# Patient Record
Sex: Female | Born: 1990 | Race: Black or African American | Hispanic: No | Marital: Single | Smoking: Never smoker
Health system: Southern US, Community
[De-identification: ages and names within clinical notes are randomized; demographics above are authoritative.]

## PROBLEM LIST (undated history)

## (undated) DIAGNOSIS — J45909 Unspecified asthma, uncomplicated: Secondary | ICD-10-CM

## (undated) DIAGNOSIS — K219 Gastro-esophageal reflux disease without esophagitis: Secondary | ICD-10-CM

## (undated) HISTORY — PX: WISDOM TOOTH EXTRACTION: SHX21

## (undated) HISTORY — PX: ARTHROSCOPIC REPAIR ACL: SUR80

---
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
## 2012-07-02 ENCOUNTER — Emergency Department (HOSPITAL_COMMUNITY)
Admission: EM | Admit: 2012-07-02 | Discharge: 2012-07-02 | Disposition: A | Discharge status: Home / Self Care | Payer: 59 | Attending: Emergency Medicine | Admitting: Emergency Medicine

## 2012-07-02 ENCOUNTER — Encounter (HOSPITAL_COMMUNITY): Payer: Self-pay | Admitting: Physical Medicine and Rehabilitation

## 2012-07-02 DIAGNOSIS — J3489 Other specified disorders of nose and nasal sinuses: Secondary | ICD-10-CM | POA: Insufficient documentation

## 2012-07-02 DIAGNOSIS — R110 Nausea: Secondary | ICD-10-CM | POA: Insufficient documentation

## 2012-07-02 DIAGNOSIS — J111 Influenza due to unidentified influenza virus with other respiratory manifestations: Secondary | ICD-10-CM

## 2012-07-02 DIAGNOSIS — R630 Anorexia: Secondary | ICD-10-CM | POA: Insufficient documentation

## 2012-07-02 DIAGNOSIS — R51 Headache: Secondary | ICD-10-CM | POA: Insufficient documentation

## 2012-07-02 DIAGNOSIS — R509 Fever, unspecified: Secondary | ICD-10-CM | POA: Insufficient documentation

## 2012-07-02 DIAGNOSIS — J020 Streptococcal pharyngitis: Secondary | ICD-10-CM | POA: Insufficient documentation

## 2012-07-02 DIAGNOSIS — R52 Pain, unspecified: Secondary | ICD-10-CM | POA: Insufficient documentation

## 2012-07-02 DIAGNOSIS — IMO0001 Reserved for inherently not codable concepts without codable children: Secondary | ICD-10-CM | POA: Insufficient documentation

## 2012-07-02 LAB — RAPID STREP SCREEN (MED CTR MEBANE ONLY): Streptococcus, Group A Screen (Direct): POSITIVE — AB

## 2012-07-02 MED ORDER — PENICILLIN V POTASSIUM 500 MG PO TABS: 500.0000 mg | ORAL_TABLET | Freq: Two times a day (BID) | ORAL | Status: AC

## 2012-07-02 MED ORDER — OSELTAMIVIR PHOSPHATE 75 MG PO CAPS: 75.0000 mg | ORAL_CAPSULE | Freq: Two times a day (BID) | ORAL | Status: DC

## 2012-07-02 NOTE — ED Notes (Signed)
Pt presents to department for evaluation of midsternal chest pain, body aches, headache, diaphoresis and fever. Onset yesterday. States temperature of 103.0, 7/10 pain at the time. She is conscious alert and oriented x4. Respirations unlabored.

## 2012-07-02 NOTE — ED Provider Notes (Signed)
History     CSN: [id number]  Arrival date & time 07/02/12  1147   First MD Initiated Contact with Patient 07/02/12 1226      Chief Complaint  Patient presents with  . Chest Pain  . Generalized Body Aches  . Headache    (Consider location/radiation/quality/duration/timing/severity/associated sxs/prior treatment) HPI Comments: Pt presents to the ED with complaints of flu-like symptoms of headache, congestion, sore throat, muscle aches, chills, fevers,  headache, and nausea. The patient states that all of her symptoms started yesterday.  She reports that her oral temperature was 103 yesterday.  Pt did not get the flu shot this year. The patient denies neck pain, weakness, vision changes, abdominal pain, vomiting, SOB,cough,  or wheezing. The patient has tried Alka-Seltzer for her symptoms, but does not feel that it helps.  No known sick contacts.  She is otherwise healthy.  The history is provided by the patient.    No past medical history on file.  No past surgical history on file.  No family history on file.  History  Substance Use Topics  . Smoking status: Never Smoker   . Smokeless tobacco: Not on file  . Alcohol Use: No    OB History    Grav Para Term Preterm Abortions TAB SAB Ect Mult Living                  Review of Systems  Constitutional: Positive for fever, chills and appetite change.  HENT: Positive for sore throat and rhinorrhea. Negative for drooling, trouble swallowing, neck pain, neck stiffness and voice change.   Eyes: Negative for photophobia and visual disturbance.  Respiratory: Negative for cough, shortness of breath and wheezing.   Gastrointestinal: Positive for nausea. Negative for vomiting, abdominal pain and diarrhea.  Genitourinary: Negative for dysuria, urgency, frequency and vaginal discharge.  Musculoskeletal: Positive for myalgias.  Skin: Negative for rash.  Neurological: Positive for headaches. Negative for dizziness, syncope, weakness,  light-headedness and numbness.  Psychiatric/Behavioral: Negative for confusion.  All other systems reviewed and are negative.    Allergies  Review of patient's allergies indicates no known allergies.  Home Medications  No current outpatient prescriptions on file.  BP 111/69  Pulse 96  Temp 98.3 F (36.8 C) (Oral)  Resp 18  SpO2 95%  LMP 06/27/2012  Physical Exam  Nursing note and vitals reviewed. Constitutional: She appears well-developed and well-nourished. No distress.  HENT:  Head: Normocephalic and atraumatic.  Right Ear: Tympanic membrane and ear canal normal.  Left Ear: Tympanic membrane and ear canal normal.  Nose: Nose normal.  Mouth/Throat: Uvula is midline and mucous membranes are normal. Posterior oropharyngeal edema and posterior oropharyngeal erythema present. No oropharyngeal exudate.       No difficulty swallowing Patient handling secretions well Normal voice  Eyes: EOM are normal. Pupils are equal, round, and reactive to light.  Neck: Normal range of motion. Neck supple.  Cardiovascular: Normal rate, regular rhythm and normal heart sounds.   Pulmonary/Chest: Effort normal and breath sounds normal. No respiratory distress. She has no wheezes. She has no rales. She exhibits tenderness.  Abdominal: Soft. Bowel sounds are normal. She exhibits no distension. There is no tenderness. There is no rebound and no guarding.  Musculoskeletal: Normal range of motion.  Lymphadenopathy:    She has cervical adenopathy.  Neurological: She is alert.  Skin: Skin is warm and dry. No rash noted. She is not diaphoretic.    ED Course  Procedures (including critical care time)  Labs Reviewed  RAPID STREP SCREEN   No results found.   No diagnosis found.    MDM  Rapid strep negative.  Patient did not want a shot.  Patient given prescription for Penicillin.  Uvula midline.  Normal voice.  Patient handling secretions well.  No signs of Peritonsillar Abscess at this  time.  Patient discharged home.  Return precautions discussed with the patient.          Pascal Lux Cromwell, PA-C 07/02/12 2157

## 2012-07-09 NOTE — ED Provider Notes (Signed)
Medical screening examination/treatment/procedure(s) were performed by non-physician practitioner and as supervising physician I was immediately available for consultation/collaboration.   Carleene Cooper III, MD 07/09/12 2045

## 2013-09-14 ENCOUNTER — Emergency Department (HOSPITAL_COMMUNITY): Payer: 59

## 2013-09-14 ENCOUNTER — Encounter (HOSPITAL_COMMUNITY): Payer: Self-pay | Admitting: Emergency Medicine

## 2013-09-14 ENCOUNTER — Emergency Department (HOSPITAL_COMMUNITY)
Admission: EM | Admit: 2013-09-14 | Discharge: 2013-09-14 | Disposition: A | Discharge status: Home / Self Care | Payer: 59 | Attending: Emergency Medicine | Admitting: Emergency Medicine

## 2013-09-14 DIAGNOSIS — R21 Rash and other nonspecific skin eruption: Secondary | ICD-10-CM | POA: Insufficient documentation

## 2013-09-14 DIAGNOSIS — S93409A Sprain of unspecified ligament of unspecified ankle, initial encounter: Secondary | ICD-10-CM | POA: Insufficient documentation

## 2013-09-14 DIAGNOSIS — X500XXA Overexertion from strenuous movement or load, initial encounter: Secondary | ICD-10-CM | POA: Insufficient documentation

## 2013-09-14 DIAGNOSIS — Y929 Unspecified place or not applicable: Secondary | ICD-10-CM | POA: Insufficient documentation

## 2013-09-14 DIAGNOSIS — Y9383 Activity, rough housing and horseplay: Secondary | ICD-10-CM | POA: Insufficient documentation

## 2013-09-14 DIAGNOSIS — S93402A Sprain of unspecified ligament of left ankle, initial encounter: Secondary | ICD-10-CM

## 2013-09-14 MED ORDER — HYDROXYZINE PAMOATE 100 MG PO CAPS: 100.0000 mg | ORAL_CAPSULE | Freq: Three times a day (TID) | ORAL | Status: DC | PRN

## 2013-09-14 MED ORDER — PERMETHRIN 5 % EX CREA: TOPICAL_CREAM | CUTANEOUS | Status: DC

## 2013-09-14 NOTE — ED Notes (Signed)
Pt. Stated, i rolled my left ankle 3 days ago and i have a rash all over for 2 days.

## 2013-09-14 NOTE — Discharge Instructions (Signed)
Ankle Sprain °An ankle sprain is an injury to the strong, fibrous tissues (ligaments) that hold the bones of your ankle joint together.  °CAUSES °An ankle sprain is usually caused by a fall or by twisting your ankle. Ankle sprains most commonly occur when you step on the outer edge of your foot, and your ankle turns inward. People who participate in sports are more prone to these types of injuries.  °SYMPTOMS  °· Pain in your ankle. The pain may be present at rest or only when you are trying to stand or walk. °· Swelling. °· Bruising. Bruising may develop immediately or within 1 to 2 days after your injury. °· Difficulty standing or walking, particularly when turning corners or changing directions. °DIAGNOSIS  °Your caregiver will ask you details about your injury and perform a physical exam of your ankle to determine if you have an ankle sprain. During the physical exam, your caregiver will press on and apply pressure to specific areas of your foot and ankle. Your caregiver will try to move your ankle in certain ways. An X-ray exam may be done to be sure a bone was not broken or a ligament did not separate from one of the bones in your ankle (avulsion fracture).  °TREATMENT  °Certain types of braces can help stabilize your ankle. Your caregiver can make a recommendation for this. Your caregiver may recommend the use of medicine for pain. If your sprain is severe, your caregiver may refer you to a surgeon who helps to restore function to parts of your skeletal system (orthopedist) or a physical therapist. °HOME CARE INSTRUCTIONS  °· Apply ice to your injury for 1 2 days or as directed by your caregiver. Applying ice helps to reduce inflammation and pain. °· Put ice in a plastic bag. °· Place a towel between your skin and the bag. °· Leave the ice on for 15-20 minutes at a time, every 2 hours while you are awake. °· Only take over-the-counter or prescription medicines for pain, discomfort, or fever as directed by  your caregiver. °· Elevate your injured ankle above the level of your heart as much as possible for 2 3 days. °· If your caregiver recommends crutches, use them as instructed. Gradually put weight on the affected ankle. Continue to use crutches or a cane until you can walk without feeling pain in your ankle. °· If you have a plaster splint, wear the splint as directed by your caregiver. Do not rest it on anything harder than a pillow for the first 24 hours. Do not put weight on it. Do not get it wet. You may take it off to take a shower or bath. °· You may have been given an elastic bandage to wear around your ankle to provide support. If the elastic bandage is too tight (you have numbness or tingling in your foot or your foot becomes cold and blue), adjust the bandage to make it comfortable. °· If you have an air splint, you may blow more air into it or let air out to make it more comfortable. You may take your splint off at night and before taking a shower or bath. Wiggle your toes in the splint several times per day to decrease swelling. °SEEK MEDICAL CARE IF:  °· You have rapidly increasing bruising or swelling. °· Your toes feel extremely cold or you lose feeling in your foot. °· Your pain is not relieved with medicine. °SEEK IMMEDIATE MEDICAL CARE IF: °· Your toes are numb   or blue.  You have severe pain that is increasing. MAKE SURE YOU:   Understand these instructions.  Will watch your condition.  Will get help right away if you are not doing well or get worse. Document Released: 07/30/2005 Document Revised: 04/23/2012 Document Reviewed: 08/11/2011 [facility]Exit[facility] Patient Information 2014 [place], [place].  Scabies  Scabies are small bugs (mites) that burrow under the skin and cause red bumps and severe itching. These bugs can only be seen with a microscope. Scabies are highly contagious. They can spread easily from person to person by direct contact. They are also spread through sharing clothing or  linens that have the scabies mites living in them. It is not unusual for an entire family to become infected through shared towels, clothing, or bedding.  HOME CARE INSTRUCTIONS  Your caregiver may prescribe a cream or lotion to kill the mites. If cream is prescribed, massage the cream into the entire body from the neck to the bottom of both feet. Also massage the cream into the scalp and face if your child is less than 22 year old. Avoid the eyes and mouth. Do not wash your hands after application.  Leave the cream on for 8 to 12 hours. Your child should bathe or shower after the 8 to 12 hour application period. Sometimes it is helpful to apply the cream to your child right before bedtime.  One treatment is usually effective and will eliminate approximately 95% of infestations. For severe cases, your caregiver may decide to repeat the treatment in 1 week. Everyone in your household should be treated with one application of the cream.  New rashes or burrows should not appear within 24 to 48 hours after successful treatment. However, the itching and rash may last for 2 to 4 weeks after successful treatment. Your caregiver may prescribe a medicine to help with the itching or to help the rash go away more quickly.  Scabies can live on clothing or linens for up to 3 days. All of your child's recently used clothing, towels, stuffed toys, and bed linens should be washed in hot water and then dried in a dryer for at least 20 minutes on high heat. Items that cannot be washed should be enclosed in a plastic bag for at least 3 days.  To help relieve itching, bathe your child in a cool bath or apply cool washcloths to the affected areas.  Your child may return to school after treatment with the prescribed cream. SEEK MEDICAL CARE IF:  The itching persists longer than 4 weeks after treatment.  The rash spreads or becomes infected. Signs of infection include red blisters or yellow-tan crust. Document Released:  07/30/2005 Document Revised: 10/22/2011 Document Reviewed: 12/08/2008  [facility] Patient Information 2014 [place]ExitCare, [place]LL[place].

## 2013-09-14 NOTE — ED Provider Notes (Signed)
CSN: [id number]     Arrival date & time 09/14/13  1814 History  This chart was scribed for non-physician practitioner Arthor Captain, PA-C, working with Toy Baker, MD by Nicholos Johns, ED scribe. This patient was seen in room TR09C/TR09C and the patient's care was started at 6:30 PM.  Chief Complaint  Patient presents with  . Ankle Pain  . Rash   The history is provided by the patient. No language interpreter was used.    HPI Comments: Ramyah Sw[place] is a 23 y.o. female who presents to the Emergency Department complaining of a diffuse rash on the neck and legs, onset 2 days ago. Pt denies change in lotion, detergent, or soap recently. Pt has never had eczema. Pt has contacts with individuals who have been diagnosed with scabies. Denies hx of asthma   Pt rolled left ankle today. States she was playing with her friend's daughter and stepped the wrong way. Reports laying in the grass for 15 minutes following incident unable to walk and had to have help getting up the stairs; was limping. Has been walking on tip toes since the incident. Reports initial swelling that has since gone down.    No past medical history on file. No past surgical history on file. No family history on file. History  Substance Use Topics  . Smoking status: Never Smoker   . Smokeless tobacco: Not on file  . Alcohol Use: No   OB History   Grav Para Term Preterm Abortions TAB SAB Ect Mult Living                 Review of Systems  Constitutional: Negative for fever and chills.  HENT: Negative for congestion and rhinorrhea.   Respiratory: Negative for cough and shortness of breath.   Musculoskeletal: Positive for myalgias.  Skin: Positive for rash.    Allergies  Review of patient's allergies indicates no known allergies.  Home Medications   Current Outpatient Rx  Name  Route  Sig  Dispense  Refill  . oseltamivir (TAMIFLU) 75 MG capsule   Oral   Take 1 capsule (75 mg total) by mouth every 12  (twelve) hours.   10 capsule   0    There were no vitals taken for this visit. Physical Exam  Nursing note and vitals reviewed. Constitutional: She is oriented to person, place, and time. She appears well-developed and well-nourished. No distress.  HENT:  Head: Normocephalic and atraumatic.  Eyes: EOM are normal.  Neck: Neck supple. No thyromegaly present.  Cardiovascular: Normal rate.   Pulmonary/Chest: Effort normal. No respiratory distress.  Musculoskeletal: Normal range of motion.  Pain and tenderness over left lateral malleolus and fifth metatarsal head. X-ray has been ordered.  Lymphadenopathy:    She has no cervical adenopathy.  Neurological: She is alert and oriented to person, place, and time.  Skin: Skin is warm and dry. Rash noted.  Fine lacy macular papular rash on neck.   Psychiatric: She has a normal mood and affect. Her behavior is normal.    ED Course  Procedures (including critical care time) DIAGNOSTIC STUDIES: Oxygen Saturation is 100% on RA, Normal by my interpretation.    COORDINATION OF CARE: At 6:30 PM: Discussed treatment plan with patient which includes x-ray of the left ankle.. Patient agrees.   Labs Review Labs Reviewed - No data to display Imaging Review Dg Ankle Complete Left  09/14/2013   CLINICAL DATA:  Ankle injury with pain and swelling.  EXAM: LEFT ANKLE  COMPLETE - 3+ VIEW  COMPARISON:  None.  FINDINGS: There is no evidence of fracture, dislocation, or joint effusion. There is no evidence of arthropathy or other focal bone abnormality. Soft tissues are unremarkable.  IMPRESSION: Normal exam.   Electronically Signed   By: Kennith CenterEric  Mansell M.D.   On: 09/14/2013 20:05    EKG Interpretation   None       MDM   1. Left ankle sprain   2. Rash    Patient X-Ray negative for obvious fracture or dislocation. Pain managed in ED. Pt advised to follow up with orthopedics if symptoms persist for possibility of missed fracture diagnosis. Patient given  brace while in ED, conservative therapy recommended and discussed. Patient will be dc home & is agreeable with above plan.  Patient rash will be treated with permethrin considering her exposure. Supportive care for ithching.  I personally performed the services described in this documentation, which was scribed in my presence. The recorded information has been reviewed and is accurate.       Arthor CaptainAbigail Janiece Scovill, PA-C 09/16/13 1057

## 2013-09-14 NOTE — ED Notes (Signed)
PT ambulated with baseline gait; VSS; A&Ox3; no signs of distress; respirations even and unlabored; skin warm and dry; no questions upon discharge.  

## 2013-09-19 NOTE — ED Provider Notes (Signed)
Medical screening examination/treatment/procedure(s) were performed by non-physician practitioner and as supervising physician I was immediately available for consultation/collaboration.  Toy BakerAnthony T Alexus Michael, MD 09/19/13 [phone]

## 2014-01-05 ENCOUNTER — Emergency Department (HOSPITAL_COMMUNITY): Payer: 59

## 2014-01-05 ENCOUNTER — Emergency Department (HOSPITAL_COMMUNITY)
Admission: EM | Admit: 2014-01-05 | Discharge: 2014-01-06 | Disposition: A | Discharge status: Home / Self Care | Payer: 59 | Attending: Emergency Medicine | Admitting: Emergency Medicine

## 2014-01-05 ENCOUNTER — Encounter (HOSPITAL_COMMUNITY): Payer: Self-pay | Admitting: Emergency Medicine

## 2014-01-05 DIAGNOSIS — R5381 Other malaise: Secondary | ICD-10-CM | POA: Insufficient documentation

## 2014-01-05 DIAGNOSIS — R000 Tachycardia, unspecified: Secondary | ICD-10-CM | POA: Insufficient documentation

## 2014-01-05 DIAGNOSIS — R51 Headache: Secondary | ICD-10-CM | POA: Insufficient documentation

## 2014-01-05 DIAGNOSIS — Z79899 Other long term (current) drug therapy: Secondary | ICD-10-CM | POA: Insufficient documentation

## 2014-01-05 DIAGNOSIS — J020 Streptococcal pharyngitis: Secondary | ICD-10-CM

## 2014-01-05 DIAGNOSIS — R5383 Other fatigue: Secondary | ICD-10-CM

## 2014-01-05 HISTORY — DX: Gastro-esophageal reflux disease without esophagitis: K21.9

## 2014-01-05 LAB — CBC
HCT: 33.6 % — ABNORMAL LOW (ref 36.0–46.0)
Hemoglobin: 11.5 g/dL — ABNORMAL LOW (ref 12.0–15.0)
MCH: 29.8 pg (ref 26.0–34.0)
MCHC: 34.2 g/dL (ref 30.0–36.0)
MCV: 87 fL (ref 78.0–100.0)
Platelets: 250 10*3/uL (ref 150–400)
RBC: 3.86 MIL/uL — ABNORMAL LOW (ref 3.87–5.11)
RDW: 14.2 % (ref 11.5–15.5)
WBC: 18.2 10*3/uL — ABNORMAL HIGH (ref 4.0–10.5)

## 2014-01-05 LAB — BASIC METABOLIC PANEL
BUN: 8 mg/dL (ref 6–23)
CO2: 22 mEq/L (ref 19–32)
Calcium: 9.6 mg/dL (ref 8.4–10.5)
Chloride: 101 mEq/L (ref 96–112)
Creatinine, Ser: 0.83 mg/dL (ref 0.50–1.10)
GFR calc Af Amer: >90 mL/min (ref >90)
GFR calc non Af Amer: >90 mL/min (ref >90)
Glucose, Bld: 90 mg/dL (ref 70–99)
Potassium: 3.3 mEq/L — ABNORMAL LOW (ref 3.7–5.3)
Sodium: 140 mEq/L (ref 137–147)

## 2014-01-05 LAB — RAPID STREP SCREEN (MED CTR MEBANE ONLY): Streptococcus, Group A Screen (Direct): POSITIVE — AB

## 2014-01-05 LAB — I-STAT TROPONIN, ED: Troponin i, poc: 0 ng/mL (ref 0.00–0.08)

## 2014-01-05 MED ORDER — SODIUM CHLORIDE 0.9 % IV BOLUS (SEPSIS): 500.0000 mL | Freq: Once | INTRAVENOUS | Status: DC

## 2014-01-05 MED ORDER — ACETAMINOPHEN 325 MG PO TABS
650.0000 mg | ORAL_TABLET | Freq: Four times a day (QID) | ORAL | Status: DC | PRN
  Administered 2014-01-05: 650 mg via ORAL

## 2014-01-05 NOTE — ED Provider Notes (Signed)
CSN: [id number]     Arrival date & time 01/05/14  1919 History   First MD Initiated Contact with Patient 01/05/14 2241     Chief Complaint  Patient presents with  . Chest Pain  . Headache     (Consider location/radiation/quality/duration/timing/severity/associated sxs/prior Treatment) Patient is a 23 y.o. female presenting with chest pain and headaches. The history is provided by the patient. No language interpreter was used.  Chest Pain Pain location:  R chest Pain quality: sharp   Pain radiates to:  Does not radiate Pain radiates to the back: no   Pain severity:  Mild Onset quality:  Sudden Duration:  8 hours Timing:  Intermittent Progression:  Waxing and waning Context: breathing   Relieved by:  None tried Worsened by:  Deep breathing Ineffective treatments:  None tried Associated symptoms: fatigue, fever and headache   Risk factors: no birth control and no smoking   Headache Associated symptoms: fatigue, fever and sore throat     Past Medical History  Diagnosis Date  . GERD (gastroesophageal reflux disease)    Past Surgical History  Procedure Laterality Date  . Arthroscopic repair acl    . Wisdom tooth extraction     No family history on file. History  Substance Use Topics  . Smoking status: Never Smoker   . Smokeless tobacco: Not on file  . Alcohol Use: No   OB History   Grav Para Term Preterm Abortions TAB SAB Ect Mult Living                 Review of Systems  Constitutional: Positive for fever and fatigue.  HENT: Positive for sore throat.   Cardiovascular: Positive for chest pain.  Neurological: Positive for headaches.  All other systems reviewed and are negative.     Allergies  Review of patient's allergies indicates no known allergies.  Home Medications   Prior to Admission medications   Medication Sig Start Date End Date Taking? Authorizing Provider  hydrOXYzine (VISTARIL) 100 MG capsule Take 1 capsule (100 mg total) by mouth 3 (three)  times daily as needed for itching. 09/14/13  Yes Abigail Harris, PA-C   BP 109/55  Pulse 100  Temp(Src) 99.9 F (37.7 C) (Oral)  Resp 24  SpO2 100%  LMP 01/04/2014 Physical Exam  Nursing note and vitals reviewed. Constitutional: She is oriented to person, place, and time. She appears well-developed and well-nourished.  HENT:  Head: Normocephalic.  Mouth/Throat: Posterior oropharyngeal erythema present.  Cardiovascular: Regular rhythm.  Tachycardia present.   Pulmonary/Chest: Effort normal and breath sounds normal.  Abdominal: Soft. Bowel sounds are normal.  Musculoskeletal: She exhibits no edema and no tenderness.  Neurological: She is alert and oriented to person, place, and time. No cranial nerve deficit.  Skin: Skin is warm and dry.  Psychiatric: She has a normal mood and affect.    ED Course  Procedures (including critical care time) Labs Review Labs Reviewed  CBC - Abnormal; Notable for the following:    WBC 18.2 (*)    RBC 3.86 (*)    Hemoglobin 11.5 (*)    HCT 33.6 (*)    All other components within normal limits  BASIC METABOLIC PANEL - Abnormal; Notable for the following:    Potassium 3.3 (*)    All other components within normal limits  Rosezena SensorI-STAT TROPOININ, ED    Imaging Review Dg Chest 2 View  01/05/2014   CLINICAL DATA:  Chest pain  EXAM: CHEST  2 VIEW  COMPARISON:  08/18/2010  FINDINGS: The heart size and mediastinal contours are within normal limits. Both lungs are clear. The visualized skeletal structures are unremarkable.  IMPRESSION: No active cardiopulmonary disease.   Electronically Signed   By: Ruel Favors M.D.   On: 01/05/2014 21:16     EKG Interpretation None      Date: 01/06/2014  Rate: 113  Rhythm: sinus tachycardia  QRS Axis: normal  Intervals: normal  ST/T Wave abnormalities: nonspecific T wave changes  Conduction Disutrbances:none  Narrative Interpretation:  Sinus tachycardia  Old EKG Reviewed: none available  Lab and radiology results  reviewed and shared with patient.  Positive for strep.  Will treat with penicilllin VK x 10 days. MDM   Final diagnoses:  None    Strep pharyngitis, pleuritic chest pain.    Jimmye Norman, NP 01/06/14 0020

## 2014-01-05 NOTE — ED Provider Notes (Signed)
Patient presented to the ER with headache, sore throat, cough, chest pain.  Face to face Exam: HEENT - PERRLA Lungs - CTAB Heart - RRR, no M/R/G Abd - S/NT/ND Neuro - alert, oriented x3  Plan: Patient has very low risk for PE. It is felt that her mild tachycardia at arrival was due to fever. She has tested positive for strep which explains her symptoms. Will treat for strep pharyngitis and pleurisy.   Gilda Crease, MD 01/05/14 [phone]

## 2014-01-05 NOTE — ED Notes (Signed)
Attempted Iv stick x2. Pt states that she wishes to just drink fluids instead of receiving them through the IV.

## 2014-01-05 NOTE — ED Notes (Signed)
Pt states that she was having a sore throat last night and today CP today around 1500 and a headache.  NAD noted at current

## 2014-01-05 NOTE — ED Notes (Signed)
Pt refuses blood draw at this time.

## 2014-01-06 MED ORDER — PENICILLIN V POTASSIUM 500 MG PO TABS: 500.0000 mg | ORAL_TABLET | Freq: Three times a day (TID) | ORAL | Status: DC

## 2014-01-06 MED ORDER — NAPROXEN 500 MG PO TABS: 500.0000 mg | ORAL_TABLET | Freq: Two times a day (BID) | ORAL | Status: DC

## 2014-01-06 MED ORDER — PENICILLIN V POTASSIUM 250 MG PO TABS
500.0000 mg | ORAL_TABLET | Freq: Once | ORAL | Status: AC
  Administered 2014-01-06: 500 mg via ORAL
  Filled 2014-01-06: qty 2

## 2014-01-06 MED ORDER — NAPROXEN 250 MG PO TABS
500.0000 mg | ORAL_TABLET | Freq: Once | ORAL | Status: AC
  Administered 2014-01-06: 500 mg via ORAL
  Filled 2014-01-06: qty 2

## 2014-01-06 NOTE — ED Provider Notes (Signed)
Medical screening examination/treatment/procedure(s) were conducted as a shared visit with non-physician practitioner(s) and myself.  I personally evaluated the patient during the encounter.  Please see separate associated note for evaluation and plan.    EKG Interpretation   Date/Time:  Tuesday Jan 05 2014 19:28:17 EDT Ventricular Rate:  113 PR Interval:  144 QRS Duration: 70 QT Interval:  288 QTC Calculation: 395 R Axis:   72 Text Interpretation:  Sinus tachycardia Nonspecific T wave abnormality  Abnormal ECG No significant change since last tracing Confirmed by Blinda Leatherwood   MD, CHRISTOPHER [phone]) on 01/05/2014 11:37:47 PM       Gilda Crease, MD 01/06/14 0021

## 2014-01-06 NOTE — Discharge Instructions (Signed)
Pleurisy Pleurisy is redness, puffiness (swelling), and soreness (inflammation) of the lining of the lungs. It can be hard to breathe and hurt to breathe. Coughing or deep breathing will make it hurt more. It is often caused by an existing infection or disease.  HOME CARE  Only take medicine as told by your doctor.  Only take antibiotic medicine as directed. Make sure to finish it even if you start to feel better. GET HELP RIGHT AWAY IF:   Your lips, fingernails, or toenails are blue or dark.  You cough up blood.  You have a hard time breathing.  Your pain is not controlled with medicine or it lasts for more than 1 week.  Your pain spreads (radiates) into your neck, arms, or jaw.  You are short of breath or wheezing.  You develop a fever, rash, throw up (vomit), or faint. MAKE SURE YOU:   Understand these instructions.  Will watch your condition.  Will get help right away if you are not doing well or get worse. Document Released: 07/12/2008 Document Revised: 04/01/2013 Document Reviewed: 01/11/2013 [facility] Patient Information 2014 [place], [place]. Strep Throat Strep throat is an infection of the throat caused by a bacteria named Streptococcus pyogenes. Your caregiver may call the infection streptococcal "tonsillitis" or "pharyngitis" depending on whether there are signs of inflammation in the tonsils or back of the throat. Strep throat is most common in children aged 5 15 years during the cold months of the year, but it can occur in people of any age during any season. This infection is spread from person to person (contagious) through coughing, sneezing, or other close contact. SYMPTOMS   Fever or chills.  Painful, swollen, red tonsils or throat.  Pain or difficulty when swallowing.  White or yellow spots on the tonsils or throat.  Swollen, tender lymph nodes or "glands" of the neck or under the jaw.  Red rash all over the body (rare). DIAGNOSIS  Many different  infections can cause the same symptoms. A test must be done to confirm the diagnosis so the right treatment can be given. A "rapid strep test" can help your caregiver make the diagnosis in a few minutes. If this test is not available, a light swab of the infected area can be used for a throat culture test. If a throat culture test is done, results are usually available in a day or two. TREATMENT  Strep throat is treated with antibiotic medicine. HOME CARE INSTRUCTIONS   Gargle with 1 tsp of salt in 1 cup of warm water, 3 4 times per day or as needed for comfort.  Family members who also have a sore throat or fever should be tested for strep throat and treated with antibiotics if they have the strep infection.  Make sure everyone in your household washes their hands well.  Do not share food, drinking cups, or personal items that could cause the infection to spread to others.  You may need to eat a soft food diet until your sore throat gets better.  Drink enough water and fluids to keep your urine clear or pale yellow. This will help prevent dehydration.  Get plenty of rest.  Stay home from school, daycare, or work until you have been on antibiotics for 24 hours.  Only take over-the-counter or prescription medicines for pain, discomfort, or fever as directed by your caregiver.  If antibiotics are prescribed, take them as directed. Finish them even if you start to feel better. SEEK MEDICAL CARE IF:  The glands in your neck continue to enlarge.  You develop a rash, cough, or earache.  You cough up green, yellow-brown, or bloody sputum.  You have pain or discomfort not controlled by medicines.  Your problems seem to be getting worse rather than better. SEEK IMMEDIATE MEDICAL CARE IF:   You develop any new symptoms such as vomiting, severe headache, stiff or painful neck, chest pain, shortness of breath, or trouble swallowing.  You develop severe throat pain, drooling, or changes in  your voice.  You develop swelling of the neck, or the skin on the neck becomes red and tender.  You have a fever.  You develop signs of dehydration, such as fatigue, dry mouth, and decreased urination.  You become increasingly sleepy, or you cannot wake up completely. Document Released: 07/27/2000 Document Revised: 07/16/2012 Document Reviewed: 09/28/2010 [facility] Patient Information 2014 [place].

## 2014-06-25 ENCOUNTER — Emergency Department (HOSPITAL_COMMUNITY)
Admission: EM | Admit: 2014-06-25 | Discharge: 2014-06-26 | Disposition: A | Discharge status: Home / Self Care | Payer: 59 | Attending: Emergency Medicine | Admitting: Emergency Medicine

## 2014-06-25 ENCOUNTER — Encounter (HOSPITAL_COMMUNITY): Payer: Self-pay | Admitting: Emergency Medicine

## 2014-06-25 ENCOUNTER — Emergency Department (HOSPITAL_COMMUNITY): Payer: 59

## 2014-06-25 DIAGNOSIS — R000 Tachycardia, unspecified: Secondary | ICD-10-CM | POA: Diagnosis not present

## 2014-06-25 DIAGNOSIS — J111 Influenza due to unidentified influenza virus with other respiratory manifestations: Secondary | ICD-10-CM | POA: Insufficient documentation

## 2014-06-25 DIAGNOSIS — R112 Nausea with vomiting, unspecified: Secondary | ICD-10-CM | POA: Diagnosis not present

## 2014-06-25 DIAGNOSIS — Z791 Long term (current) use of non-steroidal anti-inflammatories (NSAID): Secondary | ICD-10-CM | POA: Diagnosis not present

## 2014-06-25 DIAGNOSIS — R69 Illness, unspecified: Secondary | ICD-10-CM

## 2014-06-25 DIAGNOSIS — Z8719 Personal history of other diseases of the digestive system: Secondary | ICD-10-CM | POA: Diagnosis not present

## 2014-06-25 DIAGNOSIS — J45909 Unspecified asthma, uncomplicated: Secondary | ICD-10-CM | POA: Insufficient documentation

## 2014-06-25 DIAGNOSIS — R05 Cough: Secondary | ICD-10-CM | POA: Diagnosis present

## 2014-06-25 DIAGNOSIS — R079 Chest pain, unspecified: Secondary | ICD-10-CM

## 2014-06-25 DIAGNOSIS — Z792 Long term (current) use of antibiotics: Secondary | ICD-10-CM | POA: Diagnosis not present

## 2014-06-25 LAB — BASIC METABOLIC PANEL
Anion gap: 18 — ABNORMAL HIGH (ref 5–15)
BUN: 9 mg/dL (ref 6–23)
CHLORIDE: 97 meq/L (ref 96–112)
CO2: 23 meq/L (ref 19–32)
Calcium: 9.9 mg/dL (ref 8.4–10.5)
Creatinine, Ser: 0.84 mg/dL (ref 0.50–1.10)
GFR calc Af Amer: >90 mL/min (ref >90)
GLUCOSE: 80 mg/dL (ref 70–99)
Potassium: 3.8 mEq/L (ref 3.7–5.3)
Sodium: 138 mEq/L (ref 137–147)

## 2014-06-25 LAB — URINALYSIS, ROUTINE W REFLEX MICROSCOPIC
GLUCOSE, UA: NEGATIVE mg/dL
Leukocytes, UA: NEGATIVE
Nitrite: NEGATIVE
PROTEIN: NEGATIVE mg/dL
Specific Gravity, Urine: 1.028 (ref 1.005–1.030)
Urobilinogen, UA: 1 mg/dL (ref 0.0–1.0)
pH: 6.5 (ref 5.0–8.0)

## 2014-06-25 LAB — CBC
HEMATOCRIT: 37.6 % (ref 36.0–46.0)
Hemoglobin: 12.9 g/dL (ref 12.0–15.0)
MCH: 29.4 pg (ref 26.0–34.0)
MCHC: 34.3 g/dL (ref 30.0–36.0)
MCV: 85.6 fL (ref 78.0–100.0)
Platelets: 296 10*3/uL (ref 150–400)
RBC: 4.39 MIL/uL (ref 3.87–5.11)
RDW: 14 % (ref 11.5–15.5)
WBC: 17.3 10*3/uL — AB (ref 4.0–10.5)

## 2014-06-25 LAB — I-STAT CHEM 8, ED
BUN: 7 mg/dL (ref 6–23)
Calcium, Ion: 1.04 mmol/L — ABNORMAL LOW (ref 1.12–1.23)
Chloride: 103 mEq/L (ref 96–112)
Creatinine, Ser: 0.8 mg/dL (ref 0.50–1.10)
Glucose, Bld: 140 mg/dL — ABNORMAL HIGH (ref 70–99)
HCT: 38 % (ref 36.0–46.0)
HEMOGLOBIN: 12.9 g/dL (ref 12.0–15.0)
Potassium: 3.2 mEq/L — ABNORMAL LOW (ref 3.7–5.3)
SODIUM: 137 meq/L (ref 137–147)
TCO2: 21 mmol/L (ref 0–100)

## 2014-06-25 LAB — I-STAT TROPONIN, ED: TROPONIN I, POC: 0 ng/mL (ref 0.00–0.08)

## 2014-06-25 LAB — I-STAT CG4 LACTIC ACID, ED
LACTIC ACID, VENOUS: 3.57 mmol/L — AB (ref 0.5–2.2)
LACTIC ACID, VENOUS: 0.51 mmol/L (ref 0.5–2.2)

## 2014-06-25 LAB — I-STAT BETA HCG BLOOD, ED (MC, WL, AP ONLY)

## 2014-06-25 LAB — URINE MICROSCOPIC-ADD ON

## 2014-06-25 MED ORDER — ACETAMINOPHEN 325 MG PO TABS: 650.0000 mg | ORAL_TABLET | Freq: Once | ORAL | Status: DC

## 2014-06-25 MED ORDER — PROMETHAZINE HCL 25 MG PO TABS: 25.0000 mg | ORAL_TABLET | Freq: Four times a day (QID) | ORAL | Status: DC | PRN

## 2014-06-25 MED ORDER — KETOROLAC TROMETHAMINE 15 MG/ML IJ SOLN
15.0000 mg | Freq: Once | INTRAMUSCULAR | Status: AC
  Administered 2014-06-25: 15 mg via INTRAVENOUS
  Filled 2014-06-25: qty 1

## 2014-06-25 MED ORDER — HYDROCODONE-ACETAMINOPHEN 5-325 MG PO TABS: ORAL_TABLET | ORAL | Status: DC

## 2014-06-25 MED ORDER — OSELTAMIVIR PHOSPHATE 75 MG PO CAPS: 75.0000 mg | ORAL_CAPSULE | Freq: Two times a day (BID) | ORAL | Status: DC

## 2014-06-25 MED ORDER — ACETAMINOPHEN 325 MG PO TABS
650.0000 mg | ORAL_TABLET | Freq: Once | ORAL | Status: AC
  Administered 2014-06-25: 650 mg via ORAL
  Filled 2014-06-25: qty 2

## 2014-06-25 MED ORDER — SODIUM CHLORIDE 0.9 % IV BOLUS (SEPSIS)
1000.0000 mL | Freq: Once | INTRAVENOUS | Status: AC
  Administered 2014-06-25: 1000 mL via INTRAVENOUS

## 2014-06-25 MED ORDER — DIPHENHYDRAMINE HCL 50 MG/ML IJ SOLN
25.0000 mg | Freq: Once | INTRAMUSCULAR | Status: AC
  Administered 2014-06-25: 25 mg via INTRAVENOUS
  Filled 2014-06-25: qty 1

## 2014-06-25 MED ORDER — MORPHINE SULFATE 4 MG/ML IJ SOLN: 4.0000 mg | INTRAMUSCULAR | Status: DC | PRN

## 2014-06-25 MED ORDER — METOCLOPRAMIDE HCL 5 MG/ML IJ SOLN
10.0000 mg | Freq: Once | INTRAMUSCULAR | Status: AC
  Administered 2014-06-25: 10 mg via INTRAVENOUS
  Filled 2014-06-25: qty 2

## 2014-06-25 MED ORDER — OSELTAMIVIR PHOSPHATE 75 MG PO CAPS
75.0000 mg | ORAL_CAPSULE | Freq: Once | ORAL | Status: AC
  Administered 2014-06-25: 75 mg via ORAL
  Filled 2014-06-25: qty 1

## 2014-06-25 MED ORDER — ACETAMINOPHEN 325 MG PO TABS
975.0000 mg | ORAL_TABLET | Freq: Once | ORAL | Status: AC
  Administered 2014-06-25: 975 mg via ORAL
  Filled 2014-06-25: qty 3

## 2014-06-25 MED ORDER — MORPHINE SULFATE 4 MG/ML IJ SOLN
4.0000 mg | Freq: Once | INTRAMUSCULAR | Status: AC
Start: 2014-06-25 — End: 2014-06-25
  Administered 2014-06-25: 4 mg via INTRAVENOUS
  Filled 2014-06-25: qty 1

## 2014-06-25 MED ORDER — ONDANSETRON HCL 4 MG/2ML IJ SOLN
4.0000 mg | Freq: Once | INTRAMUSCULAR | Status: AC
  Administered 2014-06-25: 4 mg via INTRAVENOUS
  Filled 2014-06-25: qty 2

## 2014-06-25 MED ORDER — SODIUM CHLORIDE 0.9 % IV BOLUS (SEPSIS): 1000.0000 mL | Freq: Once | INTRAVENOUS | Status: DC

## 2014-06-25 NOTE — ED Notes (Signed)
CG-4 result reported to Nicole-PA.

## 2014-06-25 NOTE — ED Notes (Signed)
Pt reports productive cough, sob, cp with breathing, nasal congestion x 2 days.

## 2014-06-25 NOTE — ED Provider Notes (Signed)
CSN: [id number]     Arrival date & time 06/25/14  1641 History   First MD Initiated Contact with Patient 06/25/14 1955     Chief Complaint  Patient presents with  . Chest Pain  . Cough     (Consider location/radiation/quality/duration/timing/severity/associated sxs/prior Treatment) HPI   Rhodia Swaziland is a 23 y.o. female with past medical history significant for asthma complaining of rhinorrhea, productive cough, sore throat, myalgia, headache (diffuse, typical for her chronic headaches) and several episodes of nonbloody, nonbilious, non coffee-ground emesis and pleuritic CP. Patient denies shortness of breath, cervicalgia, rash, dysuria, hematuria, focal abdominal pain, diarrhea, sick contacts, tick bites, camping or hiking. She has not had her flu shot this year.  Past Medical History  Diagnosis Date  . GERD (gastroesophageal reflux disease)    Past Surgical History  Procedure Laterality Date  . Arthroscopic repair acl    . Wisdom tooth extraction     No family history on file. History  Substance Use Topics  . Smoking status: Never Smoker   . Smokeless tobacco: Not on file  . Alcohol Use: No   OB History    No data available     Review of Systems  10 systems reviewed and found to be negative, except as noted in the HPI.   Allergies  Review of patient's allergies indicates no known allergies.  Home Medications   Prior to Admission medications   Medication Sig Start Date End Date Taking? Authorizing Provider  hydrOXYzine (VISTARIL) 100 MG capsule Take 1 capsule (100 mg total) by mouth 3 (three) times daily as needed for itching. Patient not taking: Reported on 06/25/2014 09/14/13   Arthor Captain, PA-C  naproxen (NAPROSYN) 500 MG tablet Take 1 tablet (500 mg total) by mouth 2 (two) times daily. Patient not taking: Reported on 06/25/2014 01/06/14   Jimmye Norman, NP  penicillin v potassium (VEETID) 500 MG tablet Take 1 tablet (500 mg total) by mouth 3 (three) times  daily. Patient not taking: Reported on 06/25/2014 01/06/14   Jimmye Norman, NP   BP 106/70 mmHg  Pulse 113  Temp(Src) 101.1 F (38.4 C)  Resp 18  Wt 147 lb 6 oz (66.849 kg)  SpO2 100%  LMP 06/10/2014 (Approximate) Physical Exam  Constitutional: She is oriented to person, place, and time. She appears well-developed and well-nourished. No distress.  HENT:  Head: Normocephalic and atraumatic.  Mouth/Throat: Oropharynx is clear and moist.  No drooling or stridor. Posterior pharynx mildly erythematous no significant tonsillar hypertrophy. No exudate. Soft palate rises symmetrically. No TTP or induration under tongue.   No tenderness to palpation of frontal or bilateral maxillary sinuses.  No mucosal edema in the nares.  Bilateral tympanic membranes with normal architecture and good light reflex.    Eyes: Conjunctivae and EOM are normal. Pupils are equal, round, and reactive to light.  Neck: Normal range of motion. Neck supple.  FROM to C-spine. Pt can touch chin to chest without discomfort. No TTP of midline cervical spine.   Cardiovascular: Regular rhythm and intact distal pulses.   No murmur heard. Mild tachycardia  Pulmonary/Chest: Effort normal and breath sounds normal. No stridor. No respiratory distress. She has no wheezes. She has no rales. She exhibits no tenderness.  Abdominal: Soft. Bowel sounds are normal. She exhibits no distension and no mass. There is no tenderness. There is no rebound and no guarding.  Musculoskeletal: Normal range of motion. She exhibits no edema or tenderness.  Lymphadenopathy:    She  has no cervical adenopathy.  Neurological: She is alert and oriented to person, place, and time. No cranial nerve deficit.  Psychiatric: She has a normal mood and affect.  Nursing note and vitals reviewed.   ED Course  Procedures (including critical care time) Labs Review Labs Reviewed  CBC - Abnormal; Notable for the following:    WBC 17.3 (*)    All other  components within normal limits  BASIC METABOLIC PANEL - Abnormal; Notable for the following:    Anion gap 18 (*)    All other components within normal limits  I-STAT TROPOININ, ED    Imaging Review Dg Chest 2 View  06/25/2014   CLINICAL DATA:  Chest pain for 2 days was shortness of breath and cough  EXAM: CHEST  2 VIEW  COMPARISON:  01/05/2014  FINDINGS: The heart size and mediastinal contours are within normal limits. Both lungs are clear. The visualized skeletal structures are unremarkable.  IMPRESSION: No active cardiopulmonary disease.   Electronically Signed   By: Alcide CleverMark  Lukens M.D.   On: 06/25/2014 18:45     EKG Interpretation   Date/Time:  Friday June 25 2014 16:44:48 EST Ventricular Rate:  119 PR Interval:  134 QRS Duration: 66 QT Interval:  294 QTC Calculation: 413 R Axis:   68 Text Interpretation:  Sinus tachycardia Nonspecific T wave abnormality  Abnormal ECG since last tracing no significant change Confirmed by BELFI   MD, MELANIE ([id number]) on 06/25/2014 8:15:57 PM      MDM   Final diagnoses:  Chest pain   Filed Vitals:   06/25/14 2321 06/25/14 2330 06/26/14 0002 06/26/14 0028  BP: 104/51 102/53  110/58  Pulse: 104 110  100  Temp: 99 F (37.2 C)   99.1 F (37.3 C)  TempSrc: Oral   Oral  Resp: 19 18  19   Weight:      SpO2: 99% 98% 97% 99%    Medications  morphine 4 MG/ML injection 4 mg (4 mg Intravenous Given 06/25/14 2026)  ondansetron (ZOFRAN) injection 4 mg (4 mg Intravenous Given 06/25/14 2026)  sodium chloride 0.9 % bolus 1,000 mL (0 mLs Intravenous Stopped 06/25/14 2124)  acetaminophen (TYLENOL) tablet 975 mg (975 mg Oral Given 06/25/14 2026)  sodium chloride 0.9 % bolus 1,000 mL (1,000 mLs Intravenous New Bag/Given 06/25/14 2148)  ketorolac (TORADOL) 15 MG/ML injection 15 mg (15 mg Intravenous Given 06/25/14 2148)  metoCLOPramide (REGLAN) injection 10 mg (10 mg Intravenous Given 06/25/14 2150)  diphenhydrAMINE (BENADRYL) injection 25 mg (25  mg Intravenous Given 06/25/14 2149)  acetaminophen (TYLENOL) tablet 650 mg (650 mg Oral Given 06/25/14 2354)  oseltamivir (TAMIFLU) capsule 75 mg (75 mg Oral Given 06/25/14 2354)    Cira SwazilandJordan is a 23 y.o. female presenting with cough, several episodes of emesis sore throat myalgia, patient has headache which is typical for her chronic headaches. She has no meningeal signs, I doubt this is meningitis. Abdominal exam is benign. Patient is tachycardic and febrile to 101. Patient will be given fluid bolus and pain medications.  Patient has anion gap of18 and lactate of 3.5. Think this is likely due to dehydration from the vomiting and emesis. She has a leukocytosis of 13.7. Chest x-ray with no infiltrate. Triage initiated troponin an EKG are without acute abnormality. UA without nitrite, leukocytes. There are many red blood cells: patient is menstruating.  After IV fluids, recheck of lactic acid is normal at 0.51. Her gap is closed to 13. Patient is tolerating by mouth and  amenable to discharge at this time. We've had an extensive discussion of return precautions. Patient will be started on Tamiflu for  influenza-like illness in the setting of underlying pulmonary disease of asthma.  Discussed case with attending MD who agrees with plan and stability to d/c to home.   Evaluation does not show pathology that would require ongoing emergent intervention or inpatient treatment. Pt is hemodynamically stable and mentating appropriately. Discussed findings and plan with patient/guardian, who agrees with care plan. All questions answered. Return precautions discussed and outpatient follow up given.   New Prescriptions   HYDROCODONE-ACETAMINOPHEN (NORCO/VICODIN) 5-325 MG PER TABLET    Take 1-2 tablets by mouth every 6 hours as needed for pain/cough.   OSELTAMIVIR (TAMIFLU) 75 MG CAPSULE    Take 1 capsule (75 mg total) by mouth every 12 (twelve) hours.   PROMETHAZINE (PHENERGAN) 25 MG TABLET    Take 1 tablet  (25 mg total) by mouth every 6 (six) hours as needed for nausea or vomiting.        Wynetta Emeryicole Ramonia Mcclaran, PA-C 06/26/14 0026  Wynetta EmeryNicole Kellin Bartling, PA-C 06/26/14 [id number]  Rolan BuccoMelanie Belfi, MD 06/26/14 [phone]

## 2014-06-25 NOTE — Discharge Instructions (Signed)
Return to the emergency room for any worsening or concerning symptoms including fast breathing, heart racing, confusion, vomiting. ° °Rest, cover your mouth when you cough and wash your hands frequently.  ° °Push fluids: water or Gatorade, do not drink any soda, juice or caffeinated beverages. ° °For fever and pain control you can take Motrin (ibuprofen) as follows: 400 mg (this is normally 2 over the counter pills) every 4 hours with food. ° °Do not return to work until a day after your fever breaks.  ° °Take vicodin  for cough and pain control, do not drink alcohol, drive, care for children or do other critical tasks while taking vicodin ° ° ° °

## 2014-10-22 ENCOUNTER — Emergency Department (HOSPITAL_COMMUNITY)
Admission: EM | Admit: 2014-10-22 | Discharge: 2014-10-22 | Disposition: A | Discharge status: Home / Self Care | Payer: 59 | Attending: Emergency Medicine | Admitting: Emergency Medicine

## 2014-10-22 ENCOUNTER — Emergency Department (HOSPITAL_COMMUNITY): Payer: 59

## 2014-10-22 ENCOUNTER — Encounter (HOSPITAL_COMMUNITY): Payer: Self-pay | Admitting: Emergency Medicine

## 2014-10-22 DIAGNOSIS — S8992XA Unspecified injury of left lower leg, initial encounter: Secondary | ICD-10-CM | POA: Diagnosis present

## 2014-10-22 DIAGNOSIS — Y9367 Activity, basketball: Secondary | ICD-10-CM | POA: Diagnosis not present

## 2014-10-22 DIAGNOSIS — Z8719 Personal history of other diseases of the digestive system: Secondary | ICD-10-CM | POA: Insufficient documentation

## 2014-10-22 DIAGNOSIS — M25562 Pain in left knee: Secondary | ICD-10-CM

## 2014-10-22 DIAGNOSIS — Y92310 Basketball court as the place of occurrence of the external cause: Secondary | ICD-10-CM | POA: Diagnosis not present

## 2014-10-22 DIAGNOSIS — Y998 Other external cause status: Secondary | ICD-10-CM | POA: Insufficient documentation

## 2014-10-22 DIAGNOSIS — S80212A Abrasion, left knee, initial encounter: Secondary | ICD-10-CM | POA: Insufficient documentation

## 2014-10-22 DIAGNOSIS — S8392XA Sprain of unspecified site of left knee, initial encounter: Secondary | ICD-10-CM | POA: Insufficient documentation

## 2014-10-22 DIAGNOSIS — W01198A Fall on same level from slipping, tripping and stumbling with subsequent striking against other object, initial encounter: Secondary | ICD-10-CM | POA: Insufficient documentation

## 2014-10-22 DIAGNOSIS — J45909 Unspecified asthma, uncomplicated: Secondary | ICD-10-CM | POA: Diagnosis not present

## 2014-10-22 HISTORY — DX: Unspecified asthma, uncomplicated: J45.909

## 2014-10-22 MED ORDER — NAPROXEN 500 MG PO TABS: 500.0000 mg | ORAL_TABLET | Freq: Two times a day (BID) | ORAL | Status: DC

## 2014-10-22 NOTE — ED Provider Notes (Signed)
CSN: [id number]     Arrival date & time 10/22/14  [id number] History  This chart was scribed for non-physician practitioner, Raymon MuttonMarissa Porshia Blizzard, PA-C, working with Shon Batonourtney F Horton, MD by Charline BillsEssence Howell, ED Scribe. This patient was seen in room TR08C/TR08C and the patient's care was started at 9:20 AM.   Chief Complaint  Patient presents with  . Knee Injury   The history is provided by the patient. No language interpreter was used.   HPI Comments: Julie Fitzpatrick is a 24 y.o. female who presents to the Emergency Department complaining of L knee injury that occurred 2 days ago. Pt states that she was playing basketball when she fell on concrete and hit her L knee. She reports associated swelling, a tingling sensation and a constant, sharp sensation in her L knee. Pt reports bleeding and pus drainage from her abrasion yesterday that has resolved - stated that she has been cleaning the wound with Aquaphor. Stated that she has been elevating the leg, but denied icing. Stated that she has been using Ibuprofen with minimal relief. Reported that she had an injury to her right knee resulting in ACL repair a couple of years ago. She denies changes to skin color, warmth, fever, head injury, LOC, elbow pain. No previous injury to L knee. Pt's tetanus is UTD. Pt's LNMP 2 weeks ago.  PCP none   Past Medical History  Diagnosis Date  . GERD (gastroesophageal reflux disease)   . Asthma    Past Surgical History  Procedure Laterality Date  . Arthroscopic repair acl    . Wisdom tooth extraction     No family history on file. History  Substance Use Topics  . Smoking status: Never Smoker   . Smokeless tobacco: Not on file  . Alcohol Use: No   OB History    No data available     Review of Systems  Constitutional: Negative for fever.  Musculoskeletal: Positive for joint swelling and arthralgias.  Skin: Positive for wound. Negative for color change.   Allergies  Review of patient's allergies indicates no known  allergies.  Home Medications   Prior to Admission medications   Medication Sig Start Date End Date Taking? Authorizing Provider  HYDROcodone-acetaminophen (NORCO/VICODIN) 5-325 MG per tablet Take 1-2 tablets by mouth every 6 hours as needed for pain/cough. 06/25/14   Nicole Pisciotta, PA-C  naproxen (NAPROSYN) 500 MG tablet Take 1 tablet (500 mg total) by mouth 2 (two) times daily. 10/22/14   Blakely Maranan, PA-C  oseltamivir (TAMIFLU) 75 MG capsule Take 1 capsule (75 mg total) by mouth every 12 (twelve) hours. 06/25/14   Nicole Pisciotta, PA-C  promethazine (PHENERGAN) 25 MG tablet Take 1 tablet (25 mg total) by mouth every 6 (six) hours as needed for nausea or vomiting. 06/25/14   Nicole Pisciotta, PA-C   BP 113/64 mmHg  Pulse 84  Temp(Src) 98.2 F (36.8 C) (Oral)  Resp 16  Ht 5\' 3"  (1.6 m)  Wt 147 lb (66.679 kg)  BMI 26.05 kg/m2  SpO2 100%  LMP 10/07/2014 (Approximate) Physical Exam  Constitutional: She is oriented to person, place, and time. She appears well-developed and well-nourished. No distress.  HENT:  Head: Normocephalic and atraumatic.  Eyes: Conjunctivae and EOM are normal. Pupils are equal, round, and reactive to light. Right eye exhibits no discharge. Left eye exhibits no discharge.  Neck: Normal range of motion. Neck supple. No tracheal deviation present.  Cardiovascular: Normal rate, regular rhythm and normal heart sounds.   Pulses:  Radial pulses are 2+ on the right side, and 2+ on the left side.       Dorsalis pedis pulses are 2+ on the right side, and 2+ on the left side.  Pulmonary/Chest: Effort normal and breath sounds normal. No respiratory distress. She has no wheezes. She has no rales.  Musculoskeletal: She exhibits edema and tenderness.       Left knee: She exhibits decreased range of motion (Secondary to pain) and swelling. She exhibits no effusion, no ecchymosis, no deformity, no laceration and no erythema. Tenderness found. Medial joint line, lateral  joint line, MCL, LCL and patellar tendon tenderness noted.       Legs: Very mild swelling identified to the left knee. Tenderness circumferentially upon palpation. Negative warmth upon palpation. Decreased range of motion identified to the left knee secondary to pain, but patient is able to flex and extend the knee. Patient is able to flex and extend the left hip without difficulty. Full range of motion to left ankle and digits of the left foot. Negative anterior or posterior draw sign  Lymphadenopathy:    She has no cervical adenopathy.  Neurological: She is alert and oriented to person, place, and time. No cranial nerve deficit. She exhibits normal muscle tone. Coordination normal.  Skin: Skin is warm and dry. No rash noted. She is not diaphoretic. No erythema.  Superficial abrasion identified to the anterior aspect of the left knee with negative active drainage or bleeding noted-negative surrounding erythema or red streaks identified. Negative warmth upon palpation.  Psychiatric: She has a normal mood and affect. Her behavior is normal. Thought content normal.  Nursing note and vitals reviewed.   ED Course  Procedures (including critical care time) DIAGNOSTIC STUDIES: Oxygen Saturation is 100% on RA, normal by my interpretation.    COORDINATION OF CARE: 9:28 AM-Discussed treatment plan which includes XR with pt at bedside and pt agreed to plan.   Labs Review Labs Reviewed - No data to display  Imaging Review Dg Knee Complete 4 Views Left  10/22/2014   CLINICAL DATA:  Fall wall playing basketball with knee pain, initial encounter  EXAM: LEFT KNEE - COMPLETE 4+ VIEW  COMPARISON:  None.  FINDINGS: There is no evidence of fracture, dislocation, or joint effusion. There is no evidence of arthropathy or other focal bone abnormality. Soft tissues are unremarkable.  IMPRESSION: No acute abnormality noted.   Electronically Signed   By: Alcide Clever M.D.   On: 10/22/2014 10:16    EKG  Interpretation None      MDM   Final diagnoses:  Left knee pain  Left knee sprain, initial encounter    Medications - No data to display  Filed Vitals:   10/22/14 0908  BP: 113/64  Pulse: 84  Temp: 98.2 F (36.8 C)  TempSrc: Oral  Resp: 16  Height:  (1.6 m)  Weight: 147 lb (66.679 kg)  SpO2: 100%   I personally performed the services described in this documentation, which was scribed in my presence. The recorded information has been reviewed and is accurate.  Plain film of left knee abnormalities identified. Doubt quadricep tendon rupture. Doubt patellar tendon rupture. Negative findings of fracture or dislocation noted. Cannot rule out possible ligamental injury. Negative findings of septic joint-negative findings of infection of the abrasion. Negative focal neurological deficits. Pulses palpable and strong. Full flexion extension identified to left knee without difficulty. Full range of motion left ankle and digits of the toes. Sensation intact. Suspicion to  be possible sprain. Patient placed in knee immobilizer and crutches for comfort purposes. Discussed with patient to rest, ice, elevate. Referred patient to PCP and orthopedics. Discussed with patient proper wound care. Discussed with patient to closely monitor symptoms and if symptoms are to worsen or change to report back to the ED - strict return instructions given.  Patient agreed to plan of care, understood, all questions answered.   Raymon Mutton, PA-C 10/22/14 1032  Shon Baton, MD 10/23/14 1031

## 2014-10-22 NOTE — ED Notes (Signed)
Fell playing basketball last pm, falling on concrete. C/o left knee pain. Large abrasion noted. Also abrasion to right posterior forearm. Ambulatory to ED.

## 2014-10-22 NOTE — Discharge Instructions (Signed)
Please call your doctor for a followup appointment within 24-48 hours. When you talk to your doctor please let them know that you were seen in the emergency department and have them acquire all of your records so that they can discuss the findings with you and formulate a treatment plan to fully care for your new and ongoing problems. Please follow-up with your primary care provider Please follow-up with orthopedics Please rest, ice, elevate Please avoid any physical or strenuous activity Please continue to apply warm water and soap to thoroughly clean abrasion on the left knee, please Pat dry and apply antibiotic ointment such as Neosporin or bacitracin to the wound Please take medications as prescribed and on a full stomach-please do not take any other anti-inflammatories such as ibuprofen or Aleve while on this medication for this can result in stomach bleeds Please continue to monitor symptoms closely and if symptoms are to worsen or change (fever greater than 101, chills, sweating, nausea, vomiting, chest pain, shortness of breathe, difficulty breathing, weakness, numbness, tingling, worsening or changes to pain pattern, fall, injury, loss of sensation, swelling to the leg, changes to skin colored, red streaks running down the leg) please report back to the Emergency Department immediately.    Knee Pain The knee is the complex joint between your thigh and your lower leg. It is made up of bones, tendons, ligaments, and cartilage. The bones that make up the knee are:  The femur in the thigh.  The tibia and fibula in the lower leg.  The patella or kneecap riding in the groove on the lower femur. CAUSES  Knee pain is a common complaint with many causes. A few of these causes are:  Injury, such as:  A ruptured ligament or tendon injury.  Torn cartilage.  Medical conditions, such as:  Gout  Arthritis  Infections  Overuse, over training, or overdoing a physical activity. Knee pain  can be minor or severe. Knee pain can accompany debilitating injury. Minor knee problems often respond well to self-care measures or get well on their own. More serious injuries may need medical intervention or even surgery. SYMPTOMS The knee is complex. Symptoms of knee problems can vary widely. Some of the problems are:  Pain with movement and weight bearing.  Swelling and tenderness.  Buckling of the knee.  Inability to straighten or extend your knee.  Your knee locks and you cannot straighten it.  Warmth and redness with pain and fever.  Deformity or dislocation of the kneecap. DIAGNOSIS  Determining what is wrong may be very straight forward such as when there is an injury. It can also be challenging because of the complexity of the knee. Tests to make a diagnosis may include:  Your caregiver taking a history and doing a physical exam.  Routine X-rays can be used to rule out other problems. X-rays will not reveal a cartilage tear. Some injuries of the knee can be diagnosed by:  Arthroscopy a surgical technique by which a small video camera is inserted through tiny incisions on the sides of the knee. This procedure is used to examine and repair internal knee joint problems. Tiny instruments can be used during arthroscopy to repair the torn knee cartilage (meniscus).  Arthrography is a radiology technique. A contrast liquid is directly injected into the knee joint. Internal structures of the knee joint then become visible on X-ray film.  An MRI scan is a non X-ray radiology procedure in which magnetic fields and a computer produce two- or  three-dimensional images of the inside of the knee. Cartilage tears are often visible using an MRI scanner. MRI scans have largely replaced arthrography in diagnosing cartilage tears of the knee.  Blood work.  Examination of the fluid that helps to lubricate the knee joint (synovial fluid). This is done by taking a sample out using a needle and a  syringe. TREATMENT The treatment of knee problems depends on the cause. Some of these treatments are:  Depending on the injury, proper casting, splinting, surgery, or physical therapy care will be needed.  Give yourself adequate recovery time. Do not overuse your joints. If you begin to get sore during workout routines, back off. Slow down or do fewer repetitions.  For repetitive activities such as cycling or running, maintain your strength and nutrition.  Alternate muscle groups. For example, if you are a weight lifter, work the upper body on one day and the lower body the next.  Either tight or weak muscles do not give the proper support for your knee. Tight or weak muscles do not absorb the stress placed on the knee joint. Keep the muscles surrounding the knee strong.  Take care of mechanical problems.  If you have flat feet, orthotics or special shoes may help. See your caregiver if you need help.  Arch supports, sometimes with wedges on the inner or outer aspect of the heel, can help. These can shift pressure away from the side of the knee most bothered by osteoarthritis.  A brace called an "unloader" brace also may be used to help ease the pressure on the most arthritic side of the knee.  If your caregiver has prescribed crutches, braces, wraps or ice, use as directed. The acronym for this is PRICE. This means protection, rest, ice, compression, and elevation.  Nonsteroidal anti-inflammatory drugs (NSAIDs), can help relieve pain. But if taken immediately after an injury, they may actually increase swelling. Take NSAIDs with food in your stomach. Stop them if you develop stomach problems. Do not take these if you have a history of ulcers, stomach pain, or bleeding from the bowel. Do not take without your caregiver's approval if you have problems with fluid retention, heart failure, or kidney problems.  For ongoing knee problems, physical therapy may be helpful.  Glucosamine and  chondroitin are over-the-counter dietary supplements. Both may help relieve the pain of osteoarthritis in the knee. These medicines are different from the usual anti-inflammatory drugs. Glucosamine may decrease the rate of cartilage destruction.  Injections of a corticosteroid drug into your knee joint may help reduce the symptoms of an arthritis flare-up. They may provide pain relief that lasts a few months. You may have to wait a few months between injections. The injections do have a small increased risk of infection, water retention, and elevated blood sugar levels.  Hyaluronic acid injected into damaged joints may ease pain and provide lubrication. These injections may work by reducing inflammation. A series of shots may give relief for as long as 6 months.  Topical painkillers. Applying certain ointments to your skin may help relieve the pain and stiffness of osteoarthritis. Ask your pharmacist for suggestions. Many over the-counter products are approved for temporary relief of arthritis pain.  In some countries, doctors often prescribe topical NSAIDs for relief of chronic conditions such as arthritis and tendinitis. A review of treatment with NSAID creams found that they worked as well as oral medications but without the serious side effects. PREVENTION  Maintain a healthy weight. Extra pounds put more strain  on your joints.  Get strong, stay limber. Weak muscles are a common cause of knee injuries. Stretching is important. Include flexibility exercises in your workouts.  Be smart about exercise. If you have osteoarthritis, chronic knee pain or recurring injuries, you may need to change the way you exercise. This does not mean you have to stop being active. If your knees ache after jogging or playing basketball, consider switching to swimming, water aerobics, or other low-impact activities, at least for a few days a week. Sometimes limiting high-impact activities will provide relief.  Make  sure your shoes fit well. Choose footwear that is right for your sport.  Protect your knees. Use the proper gear for knee-sensitive activities. Use kneepads when playing volleyball or laying carpet. Buckle your seat belt every time you drive. Most shattered kneecaps occur in car accidents.  Rest when you are tired. SEEK MEDICAL CARE IF:  You have knee pain that is continual and does not seem to be getting better.  SEEK IMMEDIATE MEDICAL CARE IF:  Your knee joint feels hot to the touch and you have a high fever. MAKE SURE YOU:   Understand these instructions.  Will watch your condition.  Will get help right away if you are not doing well or get worse. Document Released: 05/27/2007 Document Revised: 10/22/2011 Document Reviewed: 05/27/2007 [facility] Patient Information 2015 [place] LakeExitCare, [place]. This information is not intended to replace advice given to you by your health care provider. Make sure you discuss any questions you have with your health care provider.

## 2014-10-30 ENCOUNTER — Emergency Department (HOSPITAL_COMMUNITY)
Admission: EM | Admit: 2014-10-30 | Discharge: 2014-10-30 | Disposition: A | Discharge status: Home / Self Care | Payer: 59 | Attending: Emergency Medicine | Admitting: Emergency Medicine

## 2014-10-30 ENCOUNTER — Encounter (HOSPITAL_COMMUNITY): Payer: Self-pay | Admitting: Emergency Medicine

## 2014-10-30 DIAGNOSIS — Z791 Long term (current) use of non-steroidal anti-inflammatories (NSAID): Secondary | ICD-10-CM | POA: Insufficient documentation

## 2014-10-30 DIAGNOSIS — J45909 Unspecified asthma, uncomplicated: Secondary | ICD-10-CM | POA: Diagnosis not present

## 2014-10-30 DIAGNOSIS — Z8719 Personal history of other diseases of the digestive system: Secondary | ICD-10-CM | POA: Diagnosis not present

## 2014-10-30 DIAGNOSIS — J029 Acute pharyngitis, unspecified: Secondary | ICD-10-CM | POA: Diagnosis not present

## 2014-10-30 LAB — RAPID STREP SCREEN (MED CTR MEBANE ONLY): Streptococcus, Group A Screen (Direct): NEGATIVE

## 2014-10-30 MED ORDER — IBUPROFEN 800 MG PO TABS: 800.0000 mg | ORAL_TABLET | Freq: Three times a day (TID) | ORAL | Status: DC | PRN

## 2014-10-30 MED ORDER — HYDROCODONE-ACETAMINOPHEN 7.5-325 MG/15ML PO SOLN: 10.0000 mL | Freq: Four times a day (QID) | ORAL | Status: DC | PRN

## 2014-10-30 MED ORDER — IBUPROFEN 400 MG PO TABS
800.0000 mg | ORAL_TABLET | Freq: Once | ORAL | Status: AC
  Administered 2014-10-30: 800 mg via ORAL
  Filled 2014-10-30: qty 4

## 2014-10-30 NOTE — ED Provider Notes (Signed)
CSN: [id number]     Arrival date & time 10/30/14  4098 History  This chart was scribed for non-physician practitioner, Trixie Dredge, PA-C working with Gwyneth Sprout, MD by Angelene Giovanni, ED Scribe. The patient was seen in room TR07C/TR07C and the patient's care was started at 9:19 AM      Chief Complaint  Patient presents with  . Sore Throat  . Headache   The history is provided by the patient. No language interpreter was used.   HPI Comments: Julie Fitzpatrick is a 24 y.o. female with a hx of GERD and asthma who presents to the Emergency Department complaining of a gradually worsening sore throat and a constant mild HA onset last night. She reports associated pain with swallowing and chills. She denies cough, rhinorrhea, congestion, ear pain, abdominal pain, and N/V/D. She states that she took Aleve and Tylenol last night with no relief. She denies any sick contacts at home or at work.   Past Medical History  Diagnosis Date  . GERD (gastroesophageal reflux disease)   . Asthma    Past Surgical History  Procedure Laterality Date  . Arthroscopic repair acl    . Wisdom tooth extraction     No family history on file. History  Substance Use Topics  . Smoking status: Never Smoker   . Smokeless tobacco: Not on file  . Alcohol Use: No   OB History    No data available     Review of Systems  Constitutional: Positive for chills. Negative for fever.  HENT: Positive for sore throat. Negative for congestion, ear pain and trouble swallowing.   Respiratory: Negative for cough.   Gastrointestinal: Negative for nausea, vomiting, abdominal pain and diarrhea.  Musculoskeletal: Negative for neck pain and neck stiffness.  Skin: Negative for color change.  Allergic/Immunologic: Negative for immunocompromised state.  Neurological: Positive for headaches.  Hematological: Does not bruise/bleed easily.  Psychiatric/Behavioral: Negative for self-injury.      Allergies  Review of patient's  allergies indicates no known allergies.  Home Medications   Prior to Admission medications   Medication Sig Start Date End Date Taking? Authorizing Provider  HYDROcodone-acetaminophen (NORCO/VICODIN) 5-325 MG per tablet Take 1-2 tablets by mouth every 6 hours as needed for pain/cough. 06/25/14   Nicole Pisciotta, PA-C  naproxen (NAPROSYN) 500 MG tablet Take 1 tablet (500 mg total) by mouth 2 (two) times daily. 10/22/14   Marissa Sciacca, PA-C  oseltamivir (TAMIFLU) 75 MG capsule Take 1 capsule (75 mg total) by mouth every 12 (twelve) hours. 06/25/14   Nicole Pisciotta, PA-C  promethazine (PHENERGAN) 25 MG tablet Take 1 tablet (25 mg total) by mouth every 6 (six) hours as needed for nausea or vomiting. 06/25/14   Nicole Pisciotta, PA-C   BP 115/60 mmHg  Pulse 86  Temp(Src) 98.9 F (37.2 C) (Oral)  Resp 17  Ht  (1.6 m)  Wt 154 lb (69.854 kg)  BMI 27.29 kg/m2  SpO2 98%  LMP 10/07/2014 (Approximate) Physical Exam  Constitutional: She appears well-developed and well-nourished. No distress.  HENT:  Head: Normocephalic and atraumatic.  Mouth/Throat: Oropharyngeal exudate, posterior oropharyngeal edema and posterior oropharyngeal erythema present.  Eyes: Conjunctivae are normal.  Neck: Normal range of motion. Neck supple.  Cardiovascular: Normal rate and regular rhythm.   Pulmonary/Chest: Effort normal and breath sounds normal. No stridor. No respiratory distress. She has no wheezes. She has no rales.  Lymphadenopathy:    She has no cervical adenopathy.  Neurological: She is alert.  Skin: She  is not diaphoretic.  Nursing note and vitals reviewed.   ED Course  Procedures (including critical care time) DIAGNOSTIC STUDIES: Oxygen Saturation is 98% on RA, normal by my interpretation.    COORDINATION OF CARE: 9:22 AM- Pt advised of plan for treatment and pt agrees.    Labs Review Labs Reviewed  RAPID STREP SCREEN  CULTURE, GROUP A STREP    Imaging Review No results  found.   EKG Interpretation None      MDM   Final diagnoses:  Pharyngitis    Afebrile, nontoxic patient with sore throat, chills, mild headache.  Strep screen negative, culture pending.  Pt well hydrated clinically, tolerating oral secretions easily, no airway concerns.   D/C home with pain medication, return precautions.   Discussed result, findings, treatment, and follow up  with patient.  Pt given return precautions.  Pt verbalizes understanding and agrees with plan.        I personally performed the services described in this documentation, which was scribed in my presence. The recorded information has been reviewed and is accurate.    Trixie Dredgemily Yarelis Ambrosino, PA-C 10/30/14 1126  Gwyneth SproutWhitney Plunkett, MD 10/30/14 1419

## 2014-10-30 NOTE — ED Notes (Signed)
Pt.stated, I have a headache and my throat is so bad I can't even swallow

## 2014-10-30 NOTE — Discharge Instructions (Signed)
Read the information below.  Use the prescribed medication as directed.  Please discuss all new medications with your pharmacist.  Do not take additional tylenol while taking the prescribed pain medication to avoid overdose.  You may return to the Emergency Department at any time for worsening condition or any new symptoms that concern you.    If you develop high fevers, difficulty swallowing or breathing, or you are unable to tolerate fluids by mouth, return to the ER immediately for a recheck.    ° ° °Pharyngitis °Pharyngitis is a sore throat (pharynx). There is redness, pain, and swelling of your throat. °HOME CARE  °· Drink enough fluids to keep your pee (urine) clear or pale yellow. °· Only take medicine as told by your doctor. °¨ You may get sick again if you do not take medicine as told. Finish your medicines, even if you start to feel better. °¨ Do not take aspirin. °· Rest. °· Rinse your mouth (gargle) with salt water (½ tsp of salt per 1 qt of water) every 1-2 hours. This will help the pain. °· If you are not at risk for choking, you can suck on hard candy or sore throat lozenges. °GET HELP IF: °· You have large, tender lumps on your neck. °· You have a rash. °· You cough up green, yellow-brown, or bloody spit. °GET HELP RIGHT AWAY IF:  °· You have a stiff neck. °· You drool or cannot swallow liquids. °· You throw up (vomit) or are not able to keep medicine or liquids down. °· You have very bad pain that does not go away with medicine. °· You have problems breathing (not from a stuffy nose). °MAKE SURE YOU:  °· Understand these instructions. °· Will watch your condition. °· Will get help right away if you are not doing well or get worse. °Document Released: 01/16/2008 Document Revised: 05/20/2013 Document Reviewed: 04/06/2013 °ExitCare® Patient Information ©2015 [employer]. This information is not intended to replace advice given to you by your health care provider. Make sure you discuss any questions  you have with your health care provider. ° °

## 2014-10-30 NOTE — ED Notes (Signed)
Declined W/C at D/C and was escorted to lobby by RN. 

## 2014-11-02 LAB — CULTURE, GROUP A STREP: Strep A Culture: NEGATIVE

## 2015-01-26 ENCOUNTER — Emergency Department (HOSPITAL_COMMUNITY): Payer: 59

## 2015-01-26 ENCOUNTER — Emergency Department (HOSPITAL_COMMUNITY)
Admission: EM | Admit: 2015-01-26 | Discharge: 2015-01-26 | Disposition: A | Discharge status: Home / Self Care | Payer: 59 | Attending: Emergency Medicine | Admitting: Emergency Medicine

## 2015-01-26 ENCOUNTER — Encounter (HOSPITAL_COMMUNITY): Payer: Self-pay | Admitting: *Deleted

## 2015-01-26 DIAGNOSIS — S60121A Contusion of right index finger with damage to nail, initial encounter: Secondary | ICD-10-CM | POA: Diagnosis not present

## 2015-01-26 DIAGNOSIS — S6010XA Contusion of unspecified finger with damage to nail, initial encounter: Secondary | ICD-10-CM

## 2015-01-26 DIAGNOSIS — Z8719 Personal history of other diseases of the digestive system: Secondary | ICD-10-CM | POA: Diagnosis not present

## 2015-01-26 DIAGNOSIS — Y9389 Activity, other specified: Secondary | ICD-10-CM | POA: Insufficient documentation

## 2015-01-26 DIAGNOSIS — Y9289 Other specified places as the place of occurrence of the external cause: Secondary | ICD-10-CM | POA: Insufficient documentation

## 2015-01-26 DIAGNOSIS — S6991XA Unspecified injury of right wrist, hand and finger(s), initial encounter: Secondary | ICD-10-CM | POA: Diagnosis present

## 2015-01-26 DIAGNOSIS — Y998 Other external cause status: Secondary | ICD-10-CM | POA: Diagnosis not present

## 2015-01-26 DIAGNOSIS — W230XXA Caught, crushed, jammed, or pinched between moving objects, initial encounter: Secondary | ICD-10-CM | POA: Insufficient documentation

## 2015-01-26 DIAGNOSIS — J45909 Unspecified asthma, uncomplicated: Secondary | ICD-10-CM | POA: Insufficient documentation

## 2015-01-26 MED ORDER — DICLOFENAC SODIUM 75 MG PO TBEC: 75.0000 mg | DELAYED_RELEASE_TABLET | Freq: Three times a day (TID) | ORAL | Status: DC

## 2015-01-26 MED ORDER — KETOROLAC TROMETHAMINE 60 MG/2ML IM SOLN
60.0000 mg | Freq: Once | INTRAMUSCULAR | Status: DC
  Filled 2015-01-26: qty 2

## 2015-01-26 MED ORDER — NAPROXEN 500 MG PO TABS
500.0000 mg | ORAL_TABLET | Freq: Once | ORAL | Status: AC
  Administered 2015-01-26: 500 mg via ORAL
  Filled 2015-01-26: qty 1
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
## 2015-01-26 NOTE — Discharge Instructions (Signed)
Cryotherapy °Cryotherapy means treatment with cold. Ice or gel packs can be used to reduce both pain and swelling. Ice is the most helpful within the first 24 to 48 hours after an injury or flare-up from overusing a muscle or joint. Sprains, strains, spasms, burning pain, shooting pain, and aches can all be eased with ice. Ice can also be used when recovering from surgery. Ice is effective, has very few side effects, and is safe for most people to use. °PRECAUTIONS  °Ice is not a safe treatment option for people with: °· Raynaud phenomenon. This is a condition affecting small blood vessels in the extremities. Exposure to cold may cause your problems to return. °· Cold hypersensitivity. There are many forms of cold hypersensitivity, including: °¨ Cold urticaria. Red, itchy hives appear on the skin when the tissues begin to warm after being iced. °¨ Cold erythema. This is a red, itchy rash caused by exposure to cold. °¨ Cold hemoglobinuria. Red blood cells break down when the tissues begin to warm after being iced. The hemoglobin that carry oxygen are passed into the urine because they cannot combine with blood proteins fast enough. °· Numbness or altered sensitivity in the area being iced. °If you have any of the following conditions, do not use ice until you have discussed cryotherapy with your caregiver: °· Heart conditions, such as arrhythmia, angina, or chronic heart disease. °· High blood pressure. °· Healing wounds or open skin in the area being iced. °· Current infections. °· Rheumatoid arthritis. °· Poor circulation. °· Diabetes. °Ice slows the blood flow in the region it is applied. This is beneficial when trying to stop inflamed tissues from spreading irritating chemicals to surrounding tissues. However, if you expose your skin to cold temperatures for too long or without the proper protection, you can damage your skin or nerves. Watch for signs of skin damage due to cold. °HOME CARE INSTRUCTIONS °Follow  these tips to use ice and cold packs safely. °· Place a dry or damp towel between the ice and skin. A damp towel will cool the skin more quickly, so you may need to shorten the time that the ice is used. °· For a more rapid response, add gentle compression to the ice. °· Ice for no more than 10 to 20 minutes at a time. The bonier the area you are icing, the less time it will take to get the benefits of ice. °· Check your skin after 5 minutes to make sure there are no signs of a poor response to cold or skin damage. °· Rest 20 minutes or more between uses. °· Once your skin is numb, you can end your treatment. You can test numbness by very lightly touching your skin. The touch should be so light that you do not see the skin dimple from the pressure of your fingertip. When using ice, most people will feel these normal sensations in this order: cold, burning, aching, and numbness. °· Do not use ice on someone who cannot communicate their responses to pain, such as small children or people with dementia. °HOW TO MAKE AN ICE PACK °Ice packs are the most common way to use ice therapy. Other methods include ice massage, ice baths, and cryosprays. Muscle creams that cause a cold, tingly feeling do not offer the same benefits that ice offers and should not be used as a substitute unless recommended by your caregiver. °To make an ice pack, do one of the following: °· Place crushed ice or a   bag of frozen vegetables in a sealable plastic bag. Squeeze out the excess air. Place this bag inside another plastic bag. Slide the bag into a pillowcase or place a damp towel between your skin and the bag. °· Mix 3 parts water with 1 part rubbing alcohol. Freeze the mixture in a sealable plastic bag. When you remove the mixture from the freezer, it will be slushy. Squeeze out the excess air. Place this bag inside another plastic bag. Slide the bag into a pillowcase or place a damp towel between your skin and the bag. °SEEK MEDICAL CARE  IF: °· You develop white spots on your skin. This may give the skin a blotchy (mottled) appearance. °· Your skin turns blue or pale. °· Your skin becomes waxy or hard. °· Your swelling gets worse. °MAKE SURE YOU:  °· Understand these instructions. °· Will watch your condition. °· Will get help right away if you are not doing well or get worse. °Document Released: 03/26/2011 Document Revised: 12/14/2013 Document Reviewed: 03/26/2011 °ExitCare® Patient Information ©2015 [employer]. This information is not intended to replace advice given to you by your health care provider. Make sure you discuss any questions you have with your health care provider. ° °
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
## 2015-04-27 ENCOUNTER — Emergency Department (HOSPITAL_COMMUNITY)
Admission: EM | Admit: 2015-04-27 | Discharge: 2015-04-28 | Disposition: A | Discharge status: Home / Self Care | Payer: No Typology Code available for payment source | Attending: Emergency Medicine | Admitting: Emergency Medicine

## 2015-04-27 ENCOUNTER — Encounter (HOSPITAL_COMMUNITY): Payer: Self-pay | Admitting: Neurology

## 2015-04-27 DIAGNOSIS — T7421XA Adult sexual abuse, confirmed, initial encounter: Secondary | ICD-10-CM | POA: Insufficient documentation

## 2015-04-27 DIAGNOSIS — J45909 Unspecified asthma, uncomplicated: Secondary | ICD-10-CM | POA: Diagnosis not present

## 2015-04-27 DIAGNOSIS — Z791 Long term (current) use of non-steroidal anti-inflammatories (NSAID): Secondary | ICD-10-CM | POA: Diagnosis not present

## 2015-04-27 DIAGNOSIS — Z0441 Encounter for examination and observation following alleged adult rape: Secondary | ICD-10-CM | POA: Diagnosis present

## 2015-04-27 DIAGNOSIS — Y92009 Unspecified place in unspecified non-institutional (private) residence as the place of occurrence of the external cause: Secondary | ICD-10-CM | POA: Insufficient documentation

## 2015-04-27 DIAGNOSIS — N939 Abnormal uterine and vaginal bleeding, unspecified: Secondary | ICD-10-CM | POA: Insufficient documentation

## 2015-04-27 DIAGNOSIS — IMO0002 Reserved for concepts with insufficient information to code with codable children: Secondary | ICD-10-CM

## 2015-04-27 DIAGNOSIS — S3991XA Unspecified injury of abdomen, initial encounter: Secondary | ICD-10-CM | POA: Insufficient documentation

## 2015-04-27 DIAGNOSIS — Z3202 Encounter for pregnancy test, result negative: Secondary | ICD-10-CM | POA: Diagnosis not present

## 2015-04-27 DIAGNOSIS — Y998 Other external cause status: Secondary | ICD-10-CM | POA: Insufficient documentation

## 2015-04-27 DIAGNOSIS — Y9384 Activity, sleeping: Secondary | ICD-10-CM | POA: Insufficient documentation

## 2015-04-27 DIAGNOSIS — Z8719 Personal history of other diseases of the digestive system: Secondary | ICD-10-CM | POA: Diagnosis not present

## 2015-04-27 LAB — POC URINE PREG, ED: PREG TEST UR: NEGATIVE

## 2015-04-27 MED ORDER — CEFIXIME 400 MG PO CAPS
ORAL_CAPSULE | ORAL | Status: AC
  Administered 2015-04-27: 400 mg
  Filled 2015-04-27: qty 1

## 2015-04-27 MED ORDER — METRONIDAZOLE 500 MG PO TABS
ORAL_TABLET | ORAL | Status: AC
  Administered 2015-04-27: 2000 mg
  Filled 2015-04-27: qty 4

## 2015-04-27 MED ORDER — AZITHROMYCIN 1 G PO PACK
PACK | ORAL | Status: AC
  Administered 2015-04-27: 1 g
  Filled 2015-04-27: qty 1

## 2015-04-27 MED ORDER — ULIPRISTAL ACETATE 30 MG PO TABS
ORAL_TABLET | ORAL | Status: AC
  Administered 2015-04-27: 30 mg
  Filled 2015-04-27: qty 1

## 2015-04-27 MED ORDER — PROMETHAZINE HCL 25 MG PO TABS
ORAL_TABLET | ORAL | Status: AC
  Administered 2015-04-27: 25 mg
  Filled 2015-04-27: qty 3

## 2015-04-27 NOTE — Discharge Instructions (Signed)
Sexual Assault or Rape  Sexual assault is any sexual activity that a person is forced, threatened, or coerced into participating in. It may or may not involve physical contact. You are being sexually abused if you are forced to have sexual contact of any kind. Sexual assault is called rape if penetration has occurred (vaginal, oral, or anal). Many times, sexual assaults are committed by a friend, relative, or associate. Sexual assault and rape are never the victim's fault.   Sexual assault can result in various health problems for the person who was assaulted. Some of these problems include:  · Physical injuries in the genital area or other areas of the body.  · Risk of unwanted pregnancy.  · Risk of sexually transmitted infections (STIs).  · Psychological problems such as anxiety, depression, or posttraumatic stress disorder.  WHAT STEPS SHOULD BE TAKEN AFTER A SEXUAL ASSAULT?  If you have been sexually assaulted, you should take the following steps as soon as possible:  · Go to a safe area as quickly as possible and call your local emergency services (911 in [place] Get away from the area where you have been attacked.    · Do not wash, shower, comb your hair, or clean any part of your body.    · Do not change your clothes.    · Do not remove or touch anything in the area where you were assaulted.    · Go to an emergency room for a complete physical exam. Get the necessary tests to protect yourself from STIs or pregnancy. You may be treated for an STI even if no signs of one are present. Emergency contraceptive medicines are also available to help prevent pregnancy, if this is desired. You may need to be examined by a specially trained health care provider.  · Have the health care provider collect evidence during the exam, even if you are not sure if you will file a report with the police.  · Find out how to file the correct papers with the authorities. This is important for all assaults, even if they were committed  by a family member or friend.  · Find out where you can get additional help and support, such as a local rape crisis center.  · Follow up with your health care provider as directed.    HOW CAN YOU REDUCE THE CHANCES OF SEXUAL ASSAULT?  Take the following steps to help reduce your chances of being sexually assaulted:  · Consider carrying mace or pepper spray for protection against an attacker.    · Consider taking a self-defense course.  · Do not try to fight off an attacker if he or she has a gun or knife.    · Be aware of your surroundings, what is happening around you, and who might be there.    · Be assertive, trust your instincts, and walk with confidence and direction.  · Be careful not to drink too much alcohol or use other intoxicants. These can reduce your ability to fight off an assault.  · Always lock your doors and windows. Be sure to have high-quality locks for your home.    · Do not let people enter your house if you do not know them.    · Get a home security system that has a siren if you are able.    · Protect the keys to your house and car. Do not lend them out. Do not put your name and address on them. If you lose them, get your locks changed.    · Always   lock your car and have your key ready to open the door before approaching the car.    · Park in a well-lit and busy area.  · Plan your driving routes so that you travel on well-lit and frequently used streets.   · Keep your car serviced. Always have at least half a tank of gas in it.    · Do not go into isolated areas alone. This includes open garages, empty buildings or offices, or public laundry rooms.    · Do not walk or jog alone, especially when it is dark.    · Never hitchhike.    · If your car breaks down, call the police for help on your cell phone and stay inside the car with your doors locked and windows up.    · If you are being followed, go to a busy area and call for help.    · If you are stopped by a police officer, especially one in  an unmarked police car, keep your door locked. Do not put your window down all the way. Ask the officer to show you identification first.    · Be aware of "date rape drugs" that can be placed in a drink when you are not looking. These drugs can make you unable to fight off an assault.  FOR MORE INFORMATION  · Office on Women's Health, [place] Department of Health and Human Services: www.womenshealth.gov/violence-against-women/types-of-violence/sexual-assault-and-abuse.html  · National Sexual Assault Hotline: 1-800-656-HOPE (4673)  · National Domestic Violence Hotline: 1-800-799-SAFE (7233) or www.thehotline.org  Document Released: 07/27/2000 Document Revised: 04/01/2013 Document Reviewed: 12/31/2012  ExitCare® Patient Information ©2015 [employer]. This information is not intended to replace advice given to you by your health care provider. Make sure you discuss any questions you have with your health care provider.
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
## 2015-04-27 NOTE — SANE Note (Signed)
Sexual Assault, Rape  Sexual assault can be physical, verbal, visual, or anything that forces a person to have unwanted sexual contact. You are being sexually abused if you are forced to have sexual contact of any kind (vaginal, oral, or anal). Sexual assault is called rape if penetration has occurred. Sexual assault and rape are never the victim's fault.  The physical dangers of rape include pregnancy, injury, and catching a sexually transmitted infection (STI). Your caregiver or emergency room doctor may recommend a number of tests to be done after a sexual assault or rape. A rape kit will collect evidence and check for infection and injury. You may be treated for an infection even if no signs of one are present. This may also be true if tests and cultures for disease are negative. Emergency contraceptive medications are also available to help prevent pregnancy, if this is desired. All of these options can be discussed with your caregiver. A sexual assault is a traumatic event. Counseling is available.  STEPS TO TAKE IF A SEXUAL ASSAULT HAS HAPPENED  Go to an area of safety as quickly as possible and call the police. This may include going to a shelter or staying with a friend. Stay away from the area where you have been attacked. Many times, sexual assaults are caused by a friend, relative, or associate.  Do not wash, shower, comb your hair, or clean any part of your body.  Do not change your clothes.  Do not remove or touch anything in the area where you were assaulted.  Go to an emergency room or your caregiver for a complete physical exam. Get the necessary tests to protect yourself from disease or pregnancy.  If medications were given by your caregiver, take them as directed for the full length of time prescribed. If you have come in contact with a sexual infection, find out if you are to be tested again. If your caregiver is concerned about the HIV/AIDS virus, you may be required to have continued  testing for several months. Make sure you know how to get test results. It is your responsibility to get the results of all testing done.  File the correct papers with the authorities. This is important for all assaults, even if they were done by a family member orfriend.  Only take over-the-counter or prescription medicines for pain, discomfort, or fever as directed by your caregiver. HOME CARE INSTRUCTIONS  Carry mace or pepper spray for protection against an attacker.  Do not try to fight off an attacker if he or she has a gun or knife.  Be aware of your surroundings, what is happening around you, and who might be there.  Be assertive, trust your instincts, and walk with confidence and direction.  Always lock your doors and windows.  Do not let anyone who you do not know enter your house.  Get door safety restraints and always use them.  Get a security system that has a siren if you are able.  Protect your house and car keys. Do not lend them out. Do not put your name and address on them. If you lose them, get your locks changed.  Always lock your car and have your key ready to open the door before approaching the car.  Park in a well lit and busy area.  Keep your car serviced. Always have at least half of a tank of gas in it.   POSSIBLE TREATMENT:   You may have received oral or injectable antibiotics  to help prevent sexually transmitted infections.  Hepatitis B vaccine- Immunization should be given if not already immunized.  This is a series of three injections, so any further injections should be obtained by your primary care physician.  HPV (Human Papilloma Virus) vaccine (Gardisil)- Immunization should be given, if not immunized previously or not up-to-date.  HIV (Human Immunodeficiency Virus)- Your caregiver will help you decide whether to begin the prophylactic medications, based on your circumstances.    Tetanus Immunization- This also may be recommended based on your  circumstances.  Pregnancy Prevention-  Also called "emergency contraception."  This will be offered to you if the situation indicates.  SUGGESTED FOLLOW-UP CARE:  Please follow-up with your medical care provider or where you/your child were referred (health department, women's clinic, pediatrician, etc) IN TEN DAYS TO TWO WEEKS.  We recommend the following during that visit, if indicated:  Pregnancy test  HIV, syphyllis, and Hepatitis blood testing  Cultures for sexually transmitted infections   Drive on lighted and frequently traveled streets.  Do not go into isolated areas like open garages, empty offices, buildings, or laundry rooms alone.  Do not walk or jog alone, especially when it is dark.  Never hitchhike!  If your car breaks down, call the police for help on your cell phone, put the hood of your car up, and a put a "HELP" sign on your front and back windows.  If you are being followed, go to a busy store or to someone's house and call for help.  If you are stopped by a Engineer, structural, especially in an unmarked police car, keep your door locked. Do not put your window down all the way. Ask them to show you identification first.  Beware of "date rape drugs" that can be placed in a drink when you are not looking and can make you unable to fight off an assault. They usually cause memory loss. If you know someone who has been sexually abused or raped, take them to a hospital or to the police or call your local emergency services for help.  SEEK MEDICAL CARE IF:  You have new problems because of your injuries.  You have problems that may be because of the medicine you are taking. These problems may include rash, itching, swelling, or trouble breathing.  You develop belly (abdominal) pain, you feel sick to your stomach (nausea), or you vomit.  You have an oral temperature above 102 F (38.9 C).  You need supportive care or referral to a rape crisis center. These are centers with  trained personnel who can help you get through this ordeal.  You have abnormal vaginal bleeding.  You have abnormal vaginal discharge. SEEK IMMEDIATE MEDICAL CARE IF:  You have been sexually assaulted or raped. Call your local emergency department (911 in the [place] for help.  You are afraid of being threatened, beaten, or abused. Call your local emergency department (911 in the [place] for help.  You receive new injuries related to abuse.  You have an oral temperature above 102 F (38.9 C), not controlled by medicine. For more information on sexual assault and rape call the [facility] at [phone].  Document Released: 07/27/2000 Document Revised: 10/22/2011 Document Reviewed: 09/09/2008  [facility] Patient Information 2014 [place], [place].

## 2015-04-27 NOTE — SANE Note (Signed)
For all of the medications you have received:  AVOID HAVING SEXUAL CONTACT UNTIL A WEEK AFTER ALL TREATMENT.  IF YOU HAVE CONTACTED A SEXUALLY TRANSMITTED INFECTION, YOUR PARTNER CAN BECOME INFECTED.  Do not share any of these medications with others.  Store at room temperature, away from light and moisture.  Do not store in the bathroom.  Keep all medicines away from children and pets.  Do not flush medications down the toilet or pour them in the drain.  Properly discard (contact a pharmacy) when a medication is expired or no longer needed.  Possible side effects:    Report to your healthcare provider the following:  Allergic reactions such as skin rash, itching or hives, swelling of the face, lips, or tongue; confusion; nightmares; hallucinations; dark urine or difficulty passing urine; difficulty breathing, hearing loss, irregular heartbeat or chest pain; pale or black stools; redness, blistering, peeling or loosening of the skin including inside the mouth; white patches or sores in the mouth; yellowing of the eyes or skin; feeling anxious or agitated; fever, chills, cough, sore throat or body aches; vomiting within one hour of taking the medicine.  Report only if these become bothersome:  Diarrhea, dizziness, headache, stomach upset or vomiting, tooth discoloration, vaginal irritation, or numbness in part of your body.  Precautions:  Your healthcare provider (HCP) needs to know if you have any of the following conditions:  Kidney disease, liver disease, irregular heartbeat or heart disease, an unusual or allergic reaction to any medications, foods, dyes, preservatives, or if you are pregnant or trying to get pregnant, or are breastfeeding.  Tell your HCP if your symptoms do not improve.  Do not treat diarrhea with over-the-counter products.  Contact your HCP if you have diarrhea that lasts more than 2 days or if it is severe and watery.  Potential interactions:  Question your healthcare provider  if you are taking any of the following medications:  Lincomycin, amiodarone, antacids, cyclosporine (Gengraf, Neoral, Sandimmune), digoxin (Digitek, Lanoxin), dihydroergotamine or ergotamine, Cafergot, Ergomar, Migranal, magnesium, nelfinavir, phenytoin, warfarin (Coumadin), atorvastatin (Lipitor), cetirizine (Zyrtec), medications for HIV or AIDS (efavirenz, indinavir, nelfinavir, zidovudine, Retrovir, Videx, or Viracept), or for seizure (carbamaepine, hexobarbital, phenytoin, Carbartrol, Dilantin, Tegretol, phenobarbital), sodium tetradecyl sulfate, drug or herbal products that induce enzymes such as CYP3A4, amprenavir, bosentan, modafinil, nevirapine, ritonavir, griseofulvin, rifamycins including rifabutin, St. John's Wort, troleandomycin, topiramate, felbamate, alcohol, MAO inhibitors (Nardil, Parnate, Marplan, Eldepryl), trimethobenzamide, bromocriptine, certain antidepressants, certain antihistamines, epinephrine, levodopa, medications for sleep, mental health problems, and psychotic disturbances such as amitriptyline, doxepin, nortriptyline, phenylzine, selegiline, Elavil, Pamelor, Sinequan, or medications for Parkinson's Disease, stomach problems, muscle relaxants, narcotic pain medicines or sedatives, amprenavir oral solution, paclitaxel injection, ritonavir oral solution, sertraline oral solution, sulfamethoxazole-trimethoprim injection, disulfiram (Antabuse), cimetidine (Tagamet), lithium (Eskalith),.  SPECIFIC INSTRUCTIONS FOR EACH MEDICATION (YOU MAY HAVE BEEN GIVEN ALL OR SOME OF THESE:  Azithromycin  (liquid slurry) Also known as: Zithromax, Zmax, Z-pak  Uses:  This is a macrolide antibiotic.  It is used to treat or prevent certain kinds of bacterial infections, including Chlamydia.  This medication may be used for other other purposes, but will not work for viruses such as the cold or flu.  Cefixime  (big pill) Also known as:  Suprax  Uses:  This medication is known as a cephalosporin  antibiotic.  It is used to treat a wide variety of bacterial infections, including Gonorrhea,  Ceftriaxone (Injection/Shot) Also known as:  Rocephin  Uses:  This medication is known as a  cephalosporin antibiotic.  It is used to treat certain kinds of bacterial infections.  Metronidazole (4 pills at once) Also known as:  Flagyl or Helidac Therapy  Uses:  This medication is used to treat certain kinds of baterial and protozoal infections, including Trichomoniasis (otherwise known as Trichomonas or "Trick"), which is an infection of the sex organs in men and women).  Delay taking this medication if you have had any alcohol in the past 48 hours.  Avoid alcohol (including mouthwash and cough medicine) for 48 hours afterward.  Levonorgestrel (little pill(s)) Also known as:  Plan B or Next Choice  Uses:  This medication is used in women to prevent pregnancy after unprotected sex or after failure of another birth control method.  It is a progestin hormone that helps to prevent pregnancy by delaying ovulation (the release of an egg) and possibly changing the uterine & cervical mucus to make it more difficult for fertilization (when the egg and sperm meet), or for the fertilized egg to attach to the uterus (implantation).  Using this medicine will not not stop an existing pregnancy or protect you against Sexually Transmitted Infections (STIs).  This medication should not be used as a regular form of birth control.  This medication works best if taken with 72 hours (3 days) of unprotected sex.  If you vomit with 2 hours of taking the medication, consider calling a pharmacy about repeating the dose.  This medication can be used at any time during your menstrual cycle, and your period amount and timing can be irregular after taking it, during the first month or two.  If your period is more than 7 days late, you may want to take a pregnancy test.  Promethazine (pack of 3 for home use) Also known as:   Phenergan  Uses:  This medication is an antihistamine.  It can be used to treat allergic reactions and to treat or prevent nausea and vomiting.  It is also used to make you sleep and to help treat pain or nausea.  Take 1/2 to 1 whole tablet as needed for nausea or sleep.  Do not take doses any closer than 6 hours.  If unable to swallow the pill, it may be placed in the vagina or rectum with the same results (there may be some tingling noted).  Do not drive or be responsible for the care of young children as this medication will make you drowsy. 

## 2015-06-01 ENCOUNTER — Emergency Department (HOSPITAL_COMMUNITY)
Admission: EM | Admit: 2015-06-01 | Discharge: 2015-06-01 | Disposition: A | Discharge status: Home / Self Care | Payer: 59 | Attending: Emergency Medicine | Admitting: Emergency Medicine

## 2015-06-01 ENCOUNTER — Encounter (HOSPITAL_COMMUNITY): Payer: Self-pay | Admitting: Emergency Medicine

## 2015-06-01 DIAGNOSIS — Z3202 Encounter for pregnancy test, result negative: Secondary | ICD-10-CM | POA: Insufficient documentation

## 2015-06-01 DIAGNOSIS — J019 Acute sinusitis, unspecified: Secondary | ICD-10-CM | POA: Diagnosis not present

## 2015-06-01 DIAGNOSIS — J45909 Unspecified asthma, uncomplicated: Secondary | ICD-10-CM | POA: Insufficient documentation

## 2015-06-01 DIAGNOSIS — R112 Nausea with vomiting, unspecified: Secondary | ICD-10-CM | POA: Diagnosis present

## 2015-06-01 DIAGNOSIS — J069 Acute upper respiratory infection, unspecified: Secondary | ICD-10-CM | POA: Diagnosis not present

## 2015-06-01 DIAGNOSIS — K219 Gastro-esophageal reflux disease without esophagitis: Secondary | ICD-10-CM | POA: Diagnosis not present

## 2015-06-01 DIAGNOSIS — R197 Diarrhea, unspecified: Secondary | ICD-10-CM | POA: Diagnosis not present

## 2015-06-01 DIAGNOSIS — J0191 Acute recurrent sinusitis, unspecified: Secondary | ICD-10-CM

## 2015-06-01 DIAGNOSIS — R0981 Nasal congestion: Secondary | ICD-10-CM

## 2015-06-01 LAB — CBC WITH DIFFERENTIAL/PLATELET
BASOS PCT: 0 %
Basophils Absolute: 0 10*3/uL (ref 0.0–0.1)
Eosinophils Absolute: 0.3 10*3/uL (ref 0.0–0.7)
Eosinophils Relative: 3 %
HCT: 36.3 % (ref 36.0–46.0)
HEMOGLOBIN: 11.8 g/dL — AB (ref 12.0–15.0)
LYMPHS ABS: 2.4 10*3/uL (ref 0.7–4.0)
Lymphocytes Relative: 26 %
MCH: 28.7 pg (ref 26.0–34.0)
MCHC: 32.5 g/dL (ref 30.0–36.0)
MCV: 88.3 fL (ref 78.0–100.0)
MONO ABS: 1.1 10*3/uL — AB (ref 0.1–1.0)
Monocytes Relative: 12 %
NEUTROS ABS: 5.4 10*3/uL (ref 1.7–7.7)
NEUTROS PCT: 59 %
Platelets: 272 10*3/uL (ref 150–400)
RBC: 4.11 MIL/uL (ref 3.87–5.11)
RDW: 13.9 % (ref 11.5–15.5)
WBC: 9.2 10*3/uL (ref 4.0–10.5)

## 2015-06-01 LAB — URINALYSIS, ROUTINE W REFLEX MICROSCOPIC
BILIRUBIN URINE: NEGATIVE
GLUCOSE, UA: NEGATIVE mg/dL
Ketones, ur: NEGATIVE mg/dL
Leukocytes, UA: NEGATIVE
NITRITE: NEGATIVE
Protein, ur: NEGATIVE mg/dL
SPECIFIC GRAVITY, URINE: 1.023 (ref 1.005–1.030)
Urobilinogen, UA: 1 mg/dL (ref 0.0–1.0)
pH: 6.5 (ref 5.0–8.0)

## 2015-06-01 LAB — COMPREHENSIVE METABOLIC PANEL
ALBUMIN: 3.9 g/dL (ref 3.5–5.0)
ALT: 16 U/L (ref 14–54)
ANION GAP: 10 (ref 5–15)
AST: 26 U/L (ref 15–41)
Alkaline Phosphatase: 58 U/L (ref 38–126)
BILIRUBIN TOTAL: 0.5 mg/dL (ref 0.3–1.2)
BUN: 14 mg/dL (ref 6–20)
CO2: 26 mmol/L (ref 22–32)
CREATININE: 0.9 mg/dL (ref 0.44–1.00)
Calcium: 9.5 mg/dL (ref 8.9–10.3)
Chloride: 104 mmol/L (ref 101–111)
GFR calc Af Amer: >60 mL/min (ref >60)
GFR calc non Af Amer: >60 mL/min (ref >60)
GLUCOSE: 86 mg/dL (ref 65–99)
Potassium: 4 mmol/L (ref 3.5–5.1)
SODIUM: 140 mmol/L (ref 135–145)
Total Protein: 7 g/dL (ref 6.5–8.1)

## 2015-06-01 LAB — LIPASE, BLOOD: Lipase: 44 U/L (ref 22–51)

## 2015-06-01 LAB — URINE MICROSCOPIC-ADD ON

## 2015-06-01 LAB — PREGNANCY, URINE: Preg Test, Ur: NEGATIVE

## 2015-06-01 MED ORDER — KETOROLAC TROMETHAMINE 30 MG/ML IJ SOLN
30.0000 mg | Freq: Once | INTRAMUSCULAR | Status: AC
  Administered 2015-06-01: 30 mg via INTRAVENOUS
  Filled 2015-06-01: qty 1

## 2015-06-01 MED ORDER — ONDANSETRON 4 MG PO TBDP: 8.0000 mg | ORAL_TABLET | Freq: Once | ORAL | Status: DC

## 2015-06-01 MED ORDER — ACETAMINOPHEN 325 MG PO TABS
650.0000 mg | ORAL_TABLET | Freq: Once | ORAL | Status: AC
  Administered 2015-06-01: 650 mg via ORAL
  Filled 2015-06-01: qty 2

## 2015-06-01 MED ORDER — ONDANSETRON HCL 8 MG PO TABS: 8.0000 mg | ORAL_TABLET | Freq: Three times a day (TID) | ORAL | Status: DC | PRN

## 2015-06-01 MED ORDER — FLUTICASONE PROPIONATE 50 MCG/ACT NA SUSP: 2.0000 | Freq: Every day | NASAL | Status: AC

## 2015-06-01 MED ORDER — SODIUM CHLORIDE 0.9 % IV BOLUS (SEPSIS)
1000.0000 mL | Freq: Once | INTRAVENOUS | Status: AC
  Administered 2015-06-01: 1000 mL via INTRAVENOUS

## 2015-06-01 MED ORDER — ONDANSETRON HCL 4 MG/2ML IJ SOLN
4.0000 mg | Freq: Once | INTRAMUSCULAR | Status: AC
  Administered 2015-06-01: 4 mg via INTRAVENOUS
  Filled 2015-06-01: qty 2

## 2015-06-01 NOTE — ED Notes (Signed)
Pt arrives POV with c/o emesis and diarrhea x2 days. Denies abdominal pain. ambulatory at a steady gait, a/ox4.

## 2015-06-01 NOTE — Discharge Instructions (Signed)
Gargle warm salt water and spit it out. Continue to alternate between Tylenol and Ibuprofen for pain or fever. Use Mucinex for cough suppression/expectoration of mucus. Use netipot and flonase to help with nasal congestion. May consider over-the-counter Benadryl or other antihistamine to decrease secretions and for watery itchy eyes. Use zofran as prescribed, as needed for nausea. Stay well hydrated with small sips of fluids throughout the day. Follow a BRAT (banana-rice-applesauce-toast) diet as described below for the next 24-48 hours. The 'BRAT' diet is suggested, then progress to diet as tolerated as symptoms abate. Call your doctor if bloody stools, persistent diarrhea, vomiting, fever or abdominal pain.  Followup with your primary care doctor, or use the list below to find a regular doctor, in 5-7 days for recheck of ongoing symptoms. Return to emergency department for emergent changing or worsening of symptoms.   Food Choices to Help Relieve Diarrhea When you have diarrhea, the foods you eat and your eating habits are very important. Choosing the right foods and drinks can help relieve diarrhea. Also, because diarrhea can last up to 7 days, you need to replace lost fluids and electrolytes (such as sodium, potassium, and chloride) in order to help prevent dehydration.  WHAT GENERAL GUIDELINES DO I NEED TO FOLLOW?  Slowly drink 1 cup (8 oz) of fluid for each episode of diarrhea. If you are getting enough fluid, your urine will be clear or pale yellow.  Eat starchy foods. Some good choices include white rice, white toast, pasta, low-fiber cereal, baked potatoes (without the skin), saltine crackers, and bagels.  Avoid large servings of any cooked vegetables.  Limit fruit to two servings per day. A serving is  cup or 1 small piece.  Choose foods with less than 2 g of fiber per serving.  Limit fats to less than 8 tsp (38 g) per day.  Avoid fried foods.  Eat foods that have probiotics in them.  Probiotics can be found in certain dairy products.  Avoid foods and beverages that may increase the speed at which food moves through the stomach and intestines (gastrointestinal tract). Things to avoid include:  High-fiber foods, such as dried fruit, raw fruits and vegetables, nuts, seeds, and whole grain foods.  Spicy foods and high-fat foods.  Foods and beverages sweetened with high-fructose corn syrup, honey, or sugar alcohols such as xylitol, sorbitol, and mannitol. WHAT FOODS ARE RECOMMENDED? Grains White rice. White, [place], or pita breads (fresh or toasted), including plain rolls, buns, or bagels. White pasta. Saltine, soda, or graham crackers. Pretzels. Low-fiber cereal. Cooked cereals made with water (such as cornmeal, farina, or cream cereals). Plain muffins. Matzo. Melba toast. Zwieback.  Vegetables Potatoes (without the skin). Strained tomato and vegetable juices. Most well-cooked and canned vegetables without seeds. Tender lettuce. Fruits Cooked or canned applesauce, apricots, cherries, fruit cocktail, grapefruit, peaches, pears, or plums. Fresh bananas, apples without skin, cherries, grapes, cantaloupe, grapefruit, peaches, oranges, or plums.  Meat and Other Protein Products Baked or boiled chicken. Eggs. Tofu. Fish. Seafood. Smooth peanut butter. Ground or well-cooked tender beef, ham, veal, lamb, pork, or poultry.  Dairy Plain yogurt, kefir, and unsweetened liquid yogurt. Lactose-free milk, buttermilk, or soy milk. Plain hard cheese. Beverages Sport drinks. Clear broths. Diluted fruit juices (except prune). Regular, caffeine-free sodas such as ginger ale. Water. Decaffeinated teas. Oral rehydration solutions. Sugar-free beverages not sweetened with sugar alcohols. Other Bouillon, broth, or soups made from recommended foods.  The items listed above may not be a complete list of recommended foods  or beverages. Contact your dietitian for more options. WHAT FOODS ARE NOT  RECOMMENDED? Grains Whole grain, whole wheat, bran, or rye breads, rolls, pastas, crackers, and cereals. Wild or brown rice. Cereals that contain more than 2 g of fiber per serving. Corn tortillas or taco shells. Cooked or dry oatmeal. Granola. Popcorn. Vegetables Raw vegetables. Cabbage, broccoli, Brussels sprouts, artichokes, baked beans, beet greens, corn, kale, legumes, peas, sweet potatoes, and yams. Potato skins. Cooked spinach and cabbage. Fruits Dried fruit, including raisins and dates. Raw fruits. Stewed or dried prunes. Fresh apples with skin, apricots, mangoes, pears, raspberries, and strawberries.  Meat and Other Protein Products Chunky peanut butter. Nuts and seeds. Beans and lentils. Tomasa Blase.  Dairy High-fat cheeses. Milk, chocolate milk, and beverages made with milk, such as milk shakes. Cream. Ice cream. Sweets and Desserts Sweet rolls, doughnuts, and sweet breads. Pancakes and waffles. Fats and Oils Butter. Cream sauces. Margarine. Salad oils. Plain salad dressings. Olives. Avocados.  Beverages Caffeinated beverages (such as coffee, tea, soda, or energy drinks). Alcoholic beverages. Fruit juices with pulp. Prune juice. Soft drinks sweetened with high-fructose corn syrup or sugar alcohols. Other Coconut. Hot sauce. Chili powder. Mayonnaise. Gravy. Cream-based or milk-based soups.  The items listed above may not be a complete list of foods and beverages to avoid. Contact your dietitian for more information. WHAT SHOULD I DO IF I BECOME DEHYDRATED? Diarrhea can sometimes lead to dehydration. Signs of dehydration include dark urine and dry mouth and skin. If you think you are dehydrated, you should rehydrate with an oral rehydration solution. These solutions can be purchased at pharmacies, retail stores, or online.  Drink -1 cup (120-240 mL) of oral rehydration solution each time you have an episode of diarrhea. If drinking this amount makes your diarrhea worse, try drinking smaller  amounts more often. For example, drink 1-3 tsp (5-15 mL) every 5-10 minutes.  A general rule for staying hydrated is to drink 1-2 L of fluid per day. Talk to your health care provider about the specific amount you should be drinking each day. Drink enough fluids to keep your urine clear or pale yellow. Document Released: 10/20/2003 Document Revised: 08/04/2013 Document Reviewed: 06/22/2013 [facility] Patient Information 2015 [place], [place]. This information is not intended to replace advice given to you by your health care provider. Make sure you discuss any questions you have with your health care provider.  Nausea and Vomiting Nausea is a sick feeling that often comes before throwing up (vomiting). Vomiting is a reflex where stomach contents come out of your mouth. Vomiting can cause severe loss of body fluids (dehydration). Children and elderly adults can become dehydrated quickly, especially if they also have diarrhea. Nausea and vomiting are symptoms of a condition or disease. It is important to find the cause of your symptoms. CAUSES   Direct irritation of the stomach lining. This irritation can result from increased acid production (gastroesophageal reflux disease), infection, food poisoning, taking certain medicines (such as nonsteroidal anti-inflammatory drugs), alcohol use, or tobacco use.  Signals from the brain.These signals could be caused by a headache, heat exposure, an inner ear disturbance, increased pressure in the brain from injury, infection, a tumor, or a concussion, pain, emotional stimulus, or metabolic problems.  An obstruction in the gastrointestinal tract (bowel obstruction).  Illnesses such as diabetes, hepatitis, gallbladder problems, appendicitis, kidney problems, cancer, sepsis, atypical symptoms of a heart attack, or eating disorders.  Medical treatments such as chemotherapy and radiation.  Receiving medicine that makes you sleep (general  anesthetic) during  surgery. DIAGNOSIS Your caregiver may ask for tests to be done if the problems do not improve after a few days. Tests may also be done if symptoms are severe or if the reason for the nausea and vomiting is not clear. Tests may include:  Urine tests.  Blood tests.  Stool tests.  Cultures (to look for evidence of infection).  X-rays or other imaging studies. Test results can help your caregiver make decisions about treatment or the need for additional tests. TREATMENT You need to stay well hydrated. Drink frequently but in small amounts.You may wish to drink water, sports drinks, clear broth, or eat frozen ice pops or gelatin dessert to help stay hydrated.When you eat, eating slowly may help prevent nausea.There are also some antinausea medicines that may help prevent nausea. HOME CARE INSTRUCTIONS   Take all medicine as directed by your caregiver.  If you do not have an appetite, do not force yourself to eat. However, you must continue to drink fluids.  If you have an appetite, eat a normal diet unless your caregiver tells you differently.  Eat a variety of complex carbohydrates (rice, wheat, potatoes, bread), lean meats, yogurt, fruits, and vegetables.  Avoid high-fat foods because they are more difficult to digest.  Drink enough water and fluids to keep your urine clear or pale yellow.  If you are dehydrated, ask your caregiver for specific rehydration instructions. Signs of dehydration may include:  Severe thirst.  Dry lips and mouth.  Dizziness.  Dark urine.  Decreasing urine frequency and amount.  Confusion.  Rapid breathing or pulse. SEEK IMMEDIATE MEDICAL CARE IF:   You have blood or brown flecks (like coffee grounds) in your vomit.  You have black or bloody stools.  You have a severe headache or stiff neck.  You are confused.  You have severe abdominal pain.  You have chest pain or trouble breathing.  You do not urinate at least once every 8  hours.  You develop cold or clammy skin.  You continue to vomit for longer than 24 to 48 hours.  You have a fever. MAKE SURE YOU:   Understand these instructions.  Will watch your condition.  Will get help right away if you are not doing well or get worse.   This information is not intended to replace advice given to you by your health care provider. Make sure you discuss any questions you have with your health care provider.   Document Released: 07/30/2005 Document Revised: 10/22/2011 Document Reviewed: 12/27/2010 Elsevier Interactive Patient Education 2016 [employer].  Sinusitis, Adult Sinusitis is redness, soreness, and inflammation of the paranasal sinuses. Paranasal sinuses are air pockets within the bones of your face. They are located beneath your eyes, in the middle of your forehead, and above your eyes. In healthy paranasal sinuses, mucus is able to drain out, and air is able to circulate through them by way of your nose. However, when your paranasal sinuses are inflamed, mucus and air can become trapped. This can allow bacteria and other germs to grow and cause infection. Sinusitis can develop quickly and last only a short time (acute) or continue over a long period (chronic). Sinusitis that lasts for more than 12 weeks is considered chronic. CAUSES Causes of sinusitis include:  Allergies.  Structural abnormalities, such as displacement of the cartilage that separates your nostrils (deviated septum), which can decrease the air flow through your nose and sinuses and affect sinus drainage.  Functional abnormalities, such as when  the small hairs (cilia) that line your sinuses and help remove mucus do not work properly or are not present. SIGNS AND SYMPTOMS Symptoms of acute and chronic sinusitis are the same. The primary symptoms are pain and pressure around the affected sinuses. Other symptoms include:  Upper toothache.  Earache.  Headache.  Bad breath.  Decreased  sense of smell and taste.  A cough, which worsens when you are lying flat.  Fatigue.  Fever.  Thick drainage from your nose, which often is green and may contain pus (purulent).  Swelling and warmth over the affected sinuses. DIAGNOSIS Your health care provider will perform a physical exam. During your exam, your health care provider may perform any of the following to help determine if you have acute sinusitis or chronic sinusitis:  Look in your nose for signs of abnormal growths in your nostrils (nasal polyps).  Tap over the affected sinus to check for signs of infection.  View the inside of your sinuses using an imaging device that has a light attached (endoscope). If your health care provider suspects that you have chronic sinusitis, one or more of the following tests may be recommended:  Allergy tests.  Nasal culture. A sample of mucus is taken from your nose, sent to a lab, and screened for bacteria.  Nasal cytology. A sample of mucus is taken from your nose and examined by your health care provider to determine if your sinusitis is related to an allergy. TREATMENT Most cases of acute sinusitis are related to a viral infection and will resolve on their own within 10 days. Sometimes, medicines are prescribed to help relieve symptoms of both acute and chronic sinusitis. These may include pain medicines, decongestants, nasal steroid sprays, or saline sprays. However, for sinusitis related to a bacterial infection, your health care provider will prescribe antibiotic medicines. These are medicines that will help kill the bacteria causing the infection. Rarely, sinusitis is caused by a fungal infection. In these cases, your health care provider will prescribe antifungal medicine. For some cases of chronic sinusitis, surgery is needed. Generally, these are cases in which sinusitis recurs more than 3 times per year, despite other treatments. HOME CARE INSTRUCTIONS  Drink plenty of  water. Water helps thin the mucus so your sinuses can drain more easily.  Use a humidifier.  Inhale steam 3-4 times a day (for example, sit in the bathroom with the shower running).  Apply a warm, moist washcloth to your face 3-4 times a day, or as directed by your health care provider.  Use saline nasal sprays to help moisten and clean your sinuses.  Take medicines only as directed by your health care provider.  If you were prescribed either an antibiotic or antifungal medicine, finish it all even if you start to feel better. SEEK IMMEDIATE MEDICAL CARE IF:  You have increasing pain or severe headaches.  You have nausea, vomiting, or drowsiness.  You have swelling around your face.  You have vision problems.  You have a stiff neck.  You have difficulty breathing.   This information is not intended to replace advice given to you by your health care provider. Make sure you discuss any questions you have with your health care provider.   Document Released: 07/30/2005 Document Revised: 08/20/2014 Document Reviewed: 08/14/2011 Elsevier Interactive Patient Education 2016 Elsevier Inc.  Sinus Rinse WHAT IS A SINUS RINSE? A sinus rinse is a simple home treatment that is used to rinse your sinuses with a sterile mixture of salt  and water (saline solution). Sinuses are air-filled spaces in your skull behind the bones of your face and forehead that open into your nasal cavity. You will use the following:  Saline solution.  Neti pot or spray bottle. This releases the saline solution into your nose and through your sinuses. Neti pots and spray bottles can be purchased at [employer], a health food store, or online. WHEN WOULD I DO A SINUS RINSE? A sinus rinse can help to clear mucus, dirt, dust, or pollen from the nasal cavity. You may do a sinus rinse when you have a cold, a virus, nasal allergy symptoms, a sinus infection, or stuffiness in the nose or sinuses. If you are  considering a sinus rinse:  Ask your child's health care provider before performing a sinus rinse on your child.  Do not do a sinus rinse if you have had ear or nasal surgery, ear infection, or blocked ears. HOW DO I DO A SINUS RINSE?  Wash your hands.  Disinfect your device according to the directions provided and then dry it.  Use the solution that comes with your device or one that is sold separately in stores. Follow the mixing directions on the package.  Fill your device with the amount of saline solution as directed by the device instructions.  Stand over a sink and tilt your head sideways over the sink.  Place the spout of the device in your upper nostril (the one closer to the ceiling).  Gently pour or squeeze the saline solution into the nasal cavity. The liquid should drain to the lower nostril if you are not overly congested.  Gently blow your nose. Blowing too hard may cause ear pain.  Repeat in the other nostril.  Clean and rinse your device with clean water and then air-dry it. ARE THERE RISKS OF A SINUS RINSE?  Sinus rinse is generally very safe and effective. However, there are a few risks, which include:   A burning sensation in the sinuses. This may happen if you do not make the saline solution as directed. Make sure to follow all directions when making the saline solution.  Infection from contaminated water. This is rare, but possible.  Nasal irritation.   This information is not intended to replace advice given to you by your health care provider. Make sure you discuss any questions you have with your health care provider.   Document Released: 02/24/2014 Document Reviewed: 02/24/2014 Elsevier Interactive Patient Education [employer]  Viral Infections A virus is a type of germ. Viruses can cause:  Minor sore throats.  Aches and pains.  Headaches.  Runny nose.  Rashes.  Watery eyes.  Tiredness.  Coughs.  Loss of  appetite.  Feeling sick to your stomach (nausea).  Throwing up (vomiting).  Watery poop (diarrhea). HOME CARE   Only take medicines as told by your doctor.  Drink enough water and fluids to keep your pee (urine) clear or pale yellow. Sports drinks are a good choice.  Get plenty of rest and eat healthy. Soups and broths with crackers or rice are fine. GET HELP RIGHT AWAY IF:   You have a very bad headache.  You have shortness of breath.  You have chest pain or neck pain.  You have an unusual rash.  You cannot stop throwing up.  You have watery poop that does not stop.  You cannot keep fluids down.  You or your child has a temperature by mouth above 102 F (38.9  C), not controlled by medicine.  Your baby is older than 3 months with a rectal temperature of 102 F (38.9 C) or higher.  Your baby is 33 months old or younger with a rectal temperature of 100.4 F (38 C) or higher. MAKE SURE YOU:   Understand these instructions.  Will watch this condition.  Will get help right away if you are not doing well or get worse.   This information is not intended to replace advice given to you by your health care provider. Make sure you discuss any questions you have with your health care provider.   Document Released: 07/12/2008 Document Revised: 10/22/2011 Document Reviewed: 01/05/2015 Elsevier Interactive Patient Education 2016 [employer].  Emergency Department Resource Guide 1) Find a Doctor and Pay Out of Pocket Although you won't have to find out who is covered by your insurance plan, it is a good idea to ask around and get recommendations. You will then need to call the office and see if the doctor you have chosen will accept you as a new patient and what types of options they offer for patients who are self-pay. Some doctors offer discounts or will set up payment plans for their patients who do not have insurance, but you will need to ask so you aren't surprised when you  get to your appointment.  2) Contact Your Local Health Department Not all health departments have doctors that can see patients for sick visits, but many do, so it is worth a call to see if yours does. If you don't know where your local health department is, you can check in your phone book. The CDC also has a tool to help you locate your state's health department, and many state websites also have listings of all of their local health departments.  3) Find a Walk-in Clinic If your illness is not likely to be very severe or complicated, you may want to try a walk in clinic. These are popping up all over the country in pharmacies, drugstores, and shopping centers. They're usually staffed by nurse practitioners or physician assistants that have been trained to treat common illnesses and complaints. They're usually fairly quick and inexpensive. However, if you have serious medical issues or chronic medical problems, these are probably not your best option.  No Primary Care Doctor: - Call [facility] Connect at  [phone] - they can help you locate a primary care doctor that  accepts your insurance, provides certain services, etc. - Physician Referral Service- [phone]  Chronic Pain Problems: Organization         Address  Phone   Notes  [facility]  [phone] Patients need to be referred by their primary care doctor.   Medication Assistance: Organization         Address  Phone   Notes  [facility] Medication [facility] [address] [place] [place], [place] [address] [phone] --Must be a resident of [facility] -- Must have NO insurance coverage whatsoever (no Medicaid/ Medicare, etc.) -- The pt. MUST have a primary care doctor that directs their care regularly and follows them in the community   [facility]  [phone]   [employer]  [phone]    Agencies that provide inexpensive medical care: Organization         Address  Phone    Notes  [facility]  [phone]   [facility]    [phone]  [facility] [address] [place], [place] [id number] [phone]   [facility] [address], [place] [phone]   [facility]    [phone]   [facility]    [phone]   [facility]  [address], [place] Phone:  [phone], Fax:  [phone] Hours of Operation:  9 am - 6 pm, M-F.  Also accepts Medicaid/Medicare and self-pay.  [facility]  [address], [place] Phone: [phone], Fax: [phone]. Hours of Operation:  8:30 am - 5:30 pm, M-F.  Also accepts Medicaid and self-pay.  [facility] [place] [address], [place] Phone: [phone]   [facility] [address], [place], [place] [phone], Ext. [phone] Mondays & Thursdays: 7-9 AM.  First 15 patients are seen on a first come, first serve basis.    Medicaid-accepting [facility] Providers:  Organization         Address  Phone   Notes  [facility] [address], [place] [phone] Also accepts self-pay patients.  [facility] [address], [place]  [phone]   [facility] [address], [place] [phone]   [facility] [address] [place] [address], [place] [phone]   Renaye RakersVeita Bland [address], [place]   [phone] Only accepts [place] patients after they have their name applied to their card.   Self-Pay (no insurance) in [facility]:  Organization         Address  Phone   Notes  Sickle Cell Patients, [facility] [address], [place] [phone]   [facility] [facility] [address], [place] [phone]   [facility] [place]  [address], [place] [phone]   [facility]/Dr. Osei-Bonsu  [address] [place] [address], [place] or [phone], [place] [phone] Phone number for both [place] and [place] locations is the same.  [facility] [address], [place] [phone]   [facility] [address] [place] [address], [place] or [address] [phone]   [facility] [address], [place] [phone], phone; [phone], fax Sees patients 1st and 3rd Saturday of every month.  Must not qualify for public or private insurance (i.e. Medicaid, Medicare, [place] Health Choice, Veterans' Benefits)  Household income should be no more than 200% of the poverty level The clinic cannot treat you if you are pregnant or think you are pregnant  Sexually transmitted diseases are not treated at the clinic.

## 2015-06-01 NOTE — ED Provider Notes (Signed)
CSN: [id number]     Arrival date & time 06/01/15  1308 History   First MD Initiated Contact with Patient 06/01/15 [phone]     Chief Complaint  Patient presents with  . Emesis     (Consider location/radiation/quality/duration/timing/severity/associated sxs/prior Treatment) HPI Comments: Julie Fitzpatrick is a 24 y.o. female with a PMHx of GERD and asthma, who presents to the ED with complaints of sinus congestion, rhinorrhea, nausea, vomiting with 4 episodes of nonbloody nonbilious emesis in the last 24 hours, diarrhea with 3 episodes of watery nonbloody stools in the last 24 hours, and sore throat 2 days. She also endorses some mild facial pain related to the sinus congestion, especially on the L side. She has tried Zyrtec with no relief, and states that all her symptoms worsen when she gets hot. She denies any fevers, chills, ear pain or drainage, eye symptoms, trismus, drooling, chest pain, shortness breath, cough, abdominal pain, constipation, melena, hematochezia, hematemesis, obstipation, dysuria, hematuria, vaginal bleeding or discharge, numbness, tingling, weakness, recent travel, sick contacts, suspicious food intake, antibiotic use, alcohol use, NSAID use, or prior abdominal surgeries. She reports that she feels that all of her symptoms are related to a sinus infection because she usually has these symptoms when she gets sinus infections.   Patient is a 24 y.o. female presenting with vomiting. The history is provided by the patient. No language interpreter was used.  Emesis Severity:  Mild Duration:  2 days Timing:  Constant Number of daily episodes:  4x in last 24hrs Quality:  Stomach contents Progression:  Unchanged Chronicity:  New Recent urination:  Normal Relieved by:  None tried Worsened by:  Nothing tried Ineffective treatments:  None tried Associated symptoms: diarrhea, sore throat and URI   Associated symptoms: no abdominal pain, no arthralgias, no chills and no myalgias    Risk factors: no alcohol use, no prior abdominal surgery, no sick contacts, no suspect food intake and no travel to endemic areas     Past Medical History  Diagnosis Date  . GERD (gastroesophageal reflux disease)   . Asthma    Past Surgical History  Procedure Laterality Date  . Arthroscopic repair acl    . Wisdom tooth extraction     No family history on file. Social History  Substance Use Topics  . Smoking status: Never Smoker   . Smokeless tobacco: None  . Alcohol Use: No   OB History    No data available     Review of Systems  Constitutional: Negative for fever and chills.  HENT: Positive for congestion, rhinorrhea, sinus pressure and sore throat. Negative for drooling, ear discharge, ear pain and trouble swallowing.   Respiratory: Negative for cough and shortness of breath.   Cardiovascular: Negative for chest pain.  Gastrointestinal: Positive for nausea, vomiting and diarrhea. Negative for abdominal pain and constipation.  Genitourinary: Negative for dysuria, hematuria, vaginal bleeding and vaginal discharge.  Musculoskeletal: Negative for myalgias and arthralgias.  Skin: Negative for color change.  Allergic/Immunologic: Negative for immunocompromised state.  Neurological: Negative for weakness and numbness.  Psychiatric/Behavioral: Negative for confusion.   10 Systems reviewed and are negative for acute change except as noted in the HPI.    Allergies  Review of patient's allergies indicates no known allergies.  Home Medications   Prior to Admission medications   Medication Sig Start Date End Date Taking? Authorizing Provider  diclofenac (VOLTAREN) 75 MG EC tablet Take 1 tablet (75 mg total) by mouth 3 (three) times daily. 01/26/15  April Palumbo, MD  HYDROcodone-acetaminophen (HYCET) 7.5-325 mg/15 ml solution Take 10 mLs by mouth 4 (four) times daily as needed for moderate pain or severe pain. Patient not taking: Reported on 01/26/2015 10/30/14   Trixie Dredge,  PA-C  ibuprofen (ADVIL,MOTRIN) 800 MG tablet Take 1 tablet (800 mg total) by mouth every 8 (eight) hours as needed for mild pain or moderate pain. Patient not taking: Reported on 01/26/2015 10/30/14   Trixie Dredge, PA-C  naproxen (NAPROSYN) 500 MG tablet Take 1 tablet (500 mg total) by mouth 2 (two) times daily. Patient not taking: Reported on 01/26/2015 10/22/14   Marissa Sciacca, PA-C  oseltamivir (TAMIFLU) 75 MG capsule Take 1 capsule (75 mg total) by mouth every 12 (twelve) hours. Patient not taking: Reported on 01/26/2015 06/25/14   Joni Reining Pisciotta, PA-C  promethazine (PHENERGAN) 25 MG tablet Take 1 tablet (25 mg total) by mouth every 6 (six) hours as needed for nausea or vomiting. Patient not taking: Reported on 01/26/2015 06/25/14   Joni Reining Pisciotta, PA-C   BP 117/76 mmHg  Pulse 85  Temp(Src) 97.7 F (36.5 C) (Oral)  Resp 16  SpO2 99%  LMP 05/15/2015 (Approximate) Physical Exam  Constitutional: She is oriented to person, place, and time. Vital signs are normal. She appears well-developed and well-nourished.  Non-toxic appearance. No distress.  Afebrile, nontoxic, NAD  HENT:  Head: Normocephalic and atraumatic.  Right Ear: Hearing, tympanic membrane, external ear and ear canal normal.  Left Ear: Hearing, tympanic membrane, external ear and ear canal normal.  Nose: Mucosal edema and rhinorrhea present. Right sinus exhibits maxillary sinus tenderness and frontal sinus tenderness. Left sinus exhibits maxillary sinus tenderness and frontal sinus tenderness.  Mouth/Throat: Uvula is midline, oropharynx is clear and moist and mucous membranes are normal. No trismus in the jaw. No uvula swelling.  Ears are clear bilaterally. Nose with mucosal edema and rhinorrhea, nearly complete occlusion of L nasal passage due to mucosal edema. Diffuse sinus tenderness. Oropharynx clear and moist, without uvular swelling or deviation, no trismus or drooling, no tonsillar swelling or erythema, no exudates.     Eyes: Conjunctivae and EOM are normal. Right eye exhibits no discharge. Left eye exhibits no discharge.  Neck: Normal range of motion. Neck supple.  Cardiovascular: Normal rate, regular rhythm, normal heart sounds and intact distal pulses.  Exam reveals no gallop and no friction rub.   No murmur heard. Pulmonary/Chest: Effort normal and breath sounds normal. No respiratory distress. She has no decreased breath sounds. She has no wheezes. She has no rhonchi. She has no rales.  Abdominal: Soft. Normal appearance and bowel sounds are normal. She exhibits no distension. There is no tenderness. There is no rigidity, no rebound, no guarding, no CVA tenderness, no tenderness at McBurney's point and negative Murphy's sign.  Musculoskeletal: Normal range of motion.  Lymphadenopathy:       Head (right side): No submandibular and no tonsillar adenopathy present.       Head (left side): No submandibular and no tonsillar adenopathy present.    She has cervical adenopathy.  Shotty cervical LAD bilaterally  Neurological: She is alert and oriented to person, place, and time. She has normal strength. No sensory deficit.  Skin: Skin is warm, dry and intact. No rash noted.  Psychiatric: She has a normal mood and affect.  Nursing note and vitals reviewed.   ED Course  Procedures (including critical care time) Labs Review Labs Reviewed  URINALYSIS, ROUTINE W REFLEX MICROSCOPIC (NOT AT [facility]) - Abnormal; Notable for  the following:    APPearance HAZY (*)    Hgb urine dipstick TRACE (*)    All other components within normal limits  CBC WITH DIFFERENTIAL/PLATELET - Abnormal; Notable for the following:    Hemoglobin 11.8 (*)    Monocytes Absolute 1.1 (*)    All other components within normal limits  URINE MICROSCOPIC-ADD ON - Abnormal; Notable for the following:    Squamous Epithelial / LPF MANY (*)    Bacteria, UA MANY (*)    All other components within normal limits  LIPASE, BLOOD  COMPREHENSIVE  METABOLIC PANEL  PREGNANCY, URINE  POC URINE PREG, ED    Imaging Review No results found. I have personally reviewed and evaluated these images and lab results as part of my medical decision-making.   EKG Interpretation None      MDM   Final diagnoses:  URI (upper respiratory infection)  Sinus congestion  Acute recurrent sinusitis, unspecified location  Nausea vomiting and diarrhea    24 y.o. female here with n/v/d and sinus congestion/rhinorrhea x2 days. No abd pain or tenderness. On exam, L nasal passage occluded with mucosal edema, pt states she has mild facial pain from the congestion. Will get labs, give fluids/toradol/zofran, and reassess shortly.   8:52 AM Pt requested something else for her facial pain, given tylenol. U/A contaminated with epithelial cells, no sxs of UTI and leuk/nitrite neg, therefore doubt UTI. Labs all WNL. Upreg not done while urine was in dept, therefore will change to a lab urine preg, lab notified of change and requisition sent. Awaiting this result. Will reassess shortly.   9:09 AM Pt resting comfortably. Feels better, tolerating PO well here. Upreg neg. Will d/c home with flonase and zofran, f/up with a PCP from the resource guide in 1wk. Discussed symptomatic control with tylenol/motrin/OTC meds. I explained the diagnosis and have given explicit precautions to return to the ER including for any other new or worsening symptoms. The patient understands and accepts the medical plan as it's been dictated and I have answered their questions. Discharge instructions concerning home care and prescriptions have been given. The patient is STABLE and is discharged to home in good condition.  BP 103/62 mmHg  Pulse 75  Temp(Src) 97.6 F (36.4 C) (Oral)  Resp 16  SpO2 100%  LMP 05/15/2015 (Approximate)  Meds ordered this encounter  Medications  . sodium chloride 0.9 % bolus 1,000 mL    Sig:   . ondansetron (ZOFRAN) injection 4 mg    Sig:   . ketorolac  (TORADOL) 30 MG/ML injection 30 mg    Sig:   . fluticasone (FLONASE) 50 MCG/ACT nasal spray    Sig: Place 2 sprays into both nostrils daily.    Dispense:  16 g    Refill:  0    Order Specific Question:  Supervising Provider    Answer:  MILLER, BRIAN [[id number]]  . ondansetron (ZOFRAN) 8 MG tablet    Sig: Take 1 tablet (8 mg total) by mouth every 8 (eight) hours as needed for nausea or vomiting.    Dispense:  12 tablet    Refill:  0    Order Specific Question:  Supervising Provider    Answer:  MILLER, BRIAN [[id number]]  . acetaminophen (TYLENOL) tablet 650 mg    Sig:       Sparrow Sanzo Camprubi-Soms, PA-C 06/01/15 0910  Loren Raceravid Yelverton, MD 06/04/15 [phone]

## 2015-06-01 NOTE — ED Notes (Signed)
Pt given written and verbal discharge instructions. Verbalized understanding.

## 2016-10-06 ENCOUNTER — Encounter (HOSPITAL_COMMUNITY): Payer: Self-pay | Admitting: Emergency Medicine

## 2016-10-06 ENCOUNTER — Emergency Department (HOSPITAL_COMMUNITY)
Admission: EM | Admit: 2016-10-06 | Discharge: 2016-10-07 | Disposition: A | Discharge status: Home / Self Care | Payer: 59 | Attending: Emergency Medicine | Admitting: Emergency Medicine

## 2016-10-06 DIAGNOSIS — K648 Other hemorrhoids: Secondary | ICD-10-CM | POA: Diagnosis not present

## 2016-10-06 DIAGNOSIS — J45909 Unspecified asthma, uncomplicated: Secondary | ICD-10-CM | POA: Insufficient documentation

## 2016-10-06 DIAGNOSIS — K625 Hemorrhage of anus and rectum: Secondary | ICD-10-CM | POA: Diagnosis present

## 2016-10-06 LAB — COMPREHENSIVE METABOLIC PANEL
ALT: 12 U/L — AB (ref 14–54)
AST: 22 U/L (ref 15–41)
Albumin: 4.6 g/dL (ref 3.5–5.0)
Alkaline Phosphatase: 55 U/L (ref 38–126)
Anion gap: 5 (ref 5–15)
BILIRUBIN TOTAL: 0.3 mg/dL (ref 0.3–1.2)
BUN: 7 mg/dL (ref 6–20)
CO2: 26 mmol/L (ref 22–32)
CREATININE: 0.84 mg/dL (ref 0.44–1.00)
Calcium: 9.2 mg/dL (ref 8.9–10.3)
Chloride: 108 mmol/L (ref 101–111)
Glucose, Bld: 92 mg/dL (ref 65–99)
POTASSIUM: 3.6 mmol/L (ref 3.5–5.1)
Sodium: 139 mmol/L (ref 135–145)
TOTAL PROTEIN: 8 g/dL (ref 6.5–8.1)

## 2016-10-06 LAB — CBC
HCT: 35.9 % — ABNORMAL LOW (ref 36.0–46.0)
Hemoglobin: 12.3 g/dL (ref 12.0–15.0)
MCH: 29.9 pg (ref 26.0–34.0)
MCHC: 34.3 g/dL (ref 30.0–36.0)
MCV: 87.1 fL (ref 78.0–100.0)
PLATELETS: 325 10*3/uL (ref 150–400)
RBC: 4.12 MIL/uL (ref 3.87–5.11)
RDW: 13.2 % (ref 11.5–15.5)
WBC: 10.4 10*3/uL (ref 4.0–10.5)

## 2016-10-06 LAB — TYPE AND SCREEN
ABO/RH(D): O POS
ANTIBODY SCREEN: NEGATIVE

## 2016-10-06 NOTE — ED Triage Notes (Signed)
Patient is complaining of rectal bleeding. Patient has a hx of hemorrhoids. Patient states the blood was not just on the tissue but in her stool also.

## 2016-10-07 LAB — ABO/RH: ABO/RH(D): O POS

## 2016-10-07 MED ORDER — DOCUSATE SODIUM 100 MG PO CAPS: 100.0000 mg | ORAL_CAPSULE | Freq: Two times a day (BID) | ORAL | 0 refills | Status: AC

## 2016-10-07 MED ORDER — HYDROCORTISONE 2.5 % RE CREA: TOPICAL_CREAM | RECTAL | 0 refills | Status: AC

## 2016-10-07 MED ORDER — LIDOCAINE HCL 2 % EX GEL
1.0000 "application " | Freq: Once | CUTANEOUS | Status: AC
  Administered 2016-10-07: 1
  Filled 2016-10-07: qty 11

## 2016-10-07 NOTE — ED Provider Notes (Signed)
[facility]-EMERGENCY DEPT Provider Note   CSN: [id number] Arrival date & time: 10/06/16  2028    By signing my name below, I, Valentino SaxonBianca Contreras, attest that this documentation has been prepared under the direction and in the presence of Shon Batonourtney F Horton, MD. Electronically Signed: Valentino SaxonBianca Contreras, ED Scribe. 10/07/16. 12:38 AM.  History   Chief Complaint Chief Complaint  Patient presents with  . Rectal Bleeding   The history is provided by the patient. No language interpreter was used.   HPI Comments: Julie Fitzpatrick is a 26 y.o. female with PMHx of GERD, who presents to the Emergency Department complaining of 6/10, gradually worsening, rectal bleeding onset yesterday at ~11am. Pt reports having a hx of hemorrhoids. Pt notes she had some blood in the toilet after using the restroom and with wiping. She states her rectal bleeding today is worse than her prior episodes. She reports hard stools and worsening rectal pain with sitting and stooling. She is not taking anything for her hemorrhoids with the exception of perforation 8. She has never seen a GI doctor. Denies abdominal pain, vomiting, dark stools. Past Medical History:  Diagnosis Date  . Asthma   . GERD (gastroesophageal reflux disease)     There are no active problems to display for this patient.   Past Surgical History:  Procedure Laterality Date  . ARTHROSCOPIC REPAIR ACL    . WISDOM TOOTH EXTRACTION      OB History    No data available       Home Medications    Prior to Admission medications   Medication Sig Start Date End Date Taking? Authorizing Provider  acetaminophen (TYLENOL) 500 MG tablet Take 1,000 mg by mouth every 6 (six) hours as needed for mild pain or headache.   Yes Historical Provider, MD  Prenatal Vit-Fe Fumarate-FA (PRENATAL MULTIVITAMIN) TABS tablet Take 1 tablet by mouth daily at 12 noon.   Yes Historical Provider, MD  docusate sodium (COLACE) 100 MG capsule Take 1 capsule (100 mg total) by mouth  every 12 (twelve) hours. 10/07/16   Shon Batonourtney F Horton, MD  fluticasone (FLONASE) 50 MCG/ACT nasal spray Place 2 sprays into both nostrils daily. Patient not taking: Reported on 10/06/2016 06/01/15   [address], PA-C  hydrocortisone (ANUSOL-HC) 2.5 % rectal cream Apply rectally 2 times daily 10/07/16   Shon Batonourtney F Horton, MD    Family History History reviewed. No pertinent family history.  Social History Social History  Substance Use Topics  . Smoking status: Never Smoker  . Smokeless tobacco: Never Used  . Alcohol use No     Allergies   Patient has no known allergies.   Review of Systems Review of Systems  Constitutional: Negative for fever.  Gastrointestinal: Positive for blood in stool, constipation and hematochezia. Negative for abdominal pain, diarrhea and vomiting.  Genitourinary: Positive for hematuria. Negative for dysuria.  All other systems reviewed and are negative.    Physical Exam Updated Vital Signs BP 124/74 (BP Location: Left Arm)   Pulse 78   Temp 98.6 F (37 C) (Oral)   Resp 14   Ht 5\' 3"  (1.6 m)   Wt 170 lb (77.1 kg)   LMP 09/22/2016   SpO2 100%   BMI 30.11 kg/m   Physical Exam  Constitutional: She is oriented to person, place, and time. She appears well-developed and well-nourished. No distress.  HENT:  Head: Normocephalic and atraumatic.  Cardiovascular: Normal rate, regular rhythm and normal heart sounds.   Pulmonary/Chest: Effort normal. No respiratory  distress. She has no wheezes.  Abdominal: Soft. Bowel sounds are normal. She exhibits no mass. There is no tenderness.  Genitourinary:  Genitourinary Comments: Normal external rectal exam, no obvious hemorrhoids, no redness, no fluctuance, no fissure, digital rectal exam with small palpable internal hemorrhoid, no gross blood  Neurological: She is alert and oriented to person, place, and time.  Skin: Skin is warm and dry.  Psychiatric: She has a normal mood and affect.  Nursing note and  vitals reviewed.    ED Treatments / Results   DIAGNOSTIC STUDIES: Oxygen Saturation is 100% on RA, normal by my interpretation.    COORDINATION OF CARE: 12:38 AM Discussed treatment plan with pt at bedside and pt agreed to plan.   Labs (all labs ordered are listed, but only abnormal results are displayed) Labs Reviewed  COMPREHENSIVE METABOLIC PANEL - Abnormal; Notable for the following:       Result Value   ALT 12 (*)    All other components within normal limits  CBC - Abnormal; Notable for the following:    HCT 35.9 (*)    All other components within normal limits  POC OCCULT BLOOD, ED  TYPE AND SCREEN  ABO/RH    EKG  EKG Interpretation None       Radiology No results found.  Procedures Procedures (including critical care time)  Medications Ordered in ED Medications  lidocaine (XYLOCAINE) 2 % jelly 1 application (not administered)     Initial Impression / Assessment and Plan / ED Course  I have reviewed the triage vital signs and the nursing notes.  Pertinent labs & imaging results that were available during my care of the patient were reviewed by me and considered in my medical decision making (see chart for details).     Defecation. Nontoxic on exam. No active bleeding. Hemoglobin negative. No external hemorrhoids noted but patient's family member states that she had an external hemorrhoid yesterday that she treated. She does have a small palpable internal hemorrhoid. Will treat supportively with sitz baths, stool softeners, and Anusol.  Follow-up with gastroenterology provided.  After history, exam, and medical workup I feel the patient has been appropriately medically screened and is safe for discharge home. Pertinent diagnoses were discussed with the patient. Patient was given return precautions.   Final Clinical Impressions(s) / ED Diagnoses   Final diagnoses:  Rectal bleeding  Internal hemorrhoid    New Prescriptions New Prescriptions    DOCUSATE SODIUM (COLACE) 100 MG CAPSULE    Take 1 capsule (100 mg total) by mouth every 12 (twelve) hours.   HYDROCORTISONE (ANUSOL-HC) 2.5 % RECTAL CREAM    Apply rectally 2 times daily    I personally performed the services described in this documentation, which was scribed in my presence. The recorded information has been reviewed and is accurate.      Shon Baton, MD 10/07/16 [phone]

## 2016-10-07 NOTE — Discharge Instructions (Signed)
You were seen today for rectal bleeding. You give a history of hemorrhoids although no significant hemorrhoids were seen on exam. Follow-up with gastroenterology. If you have worsening bleeding or pain, developed dizziness or any new or worsening symptoms she should be reevaluated.

## 2017-10-09 ENCOUNTER — Other Ambulatory Visit: Payer: Self-pay | Admitting: Occupational Medicine

## 2017-10-09 ENCOUNTER — Ambulatory Visit: Payer: Self-pay

## 2017-10-09 DIAGNOSIS — M25531 Pain in right wrist: Secondary | ICD-10-CM

## 2018-09-18 ENCOUNTER — Encounter (HOSPITAL_COMMUNITY): Payer: Self-pay | Admitting: Emergency Medicine

## 2018-09-18 ENCOUNTER — Emergency Department (HOSPITAL_COMMUNITY): Payer: Self-pay

## 2018-09-18 ENCOUNTER — Emergency Department (HOSPITAL_COMMUNITY)
Admission: EM | Admit: 2018-09-18 | Discharge: 2018-09-18 | Disposition: A | Discharge status: Home / Self Care | Payer: Self-pay | Attending: Emergency Medicine | Admitting: Emergency Medicine

## 2018-09-18 DIAGNOSIS — R079 Chest pain, unspecified: Secondary | ICD-10-CM | POA: Insufficient documentation

## 2018-09-18 DIAGNOSIS — J45909 Unspecified asthma, uncomplicated: Secondary | ICD-10-CM | POA: Insufficient documentation

## 2018-09-18 LAB — I-STAT TROPONIN, ED: TROPONIN I, POC: 0 ng/mL (ref 0.00–0.08)

## 2018-09-18 MED ORDER — KETOROLAC TROMETHAMINE 15 MG/ML IJ SOLN
15.0000 mg | Freq: Once | INTRAMUSCULAR | Status: AC
  Administered 2018-09-18: 15 mg via INTRAVENOUS
  Filled 2018-09-18: qty 1

## 2018-09-18 NOTE — Discharge Instructions (Addendum)
You were evaluated in the Emergency Department and after careful evaluation, we did not find any emergent condition requiring admission or further testing in the hospital.  Your symptoms today seem to be due to inflammation of the joints between the ribs.  Please use Tylenol or ibuprofen at home for pain and follow-up with your primary care provider.  Please return to the Emergency Department if you experience any worsening of your condition.  We encourage you to follow up with a primary care provider.  Thank you for allowing [place] to be a part of your care.

## 2018-09-18 NOTE — ED Triage Notes (Signed)
Patient reports intermittent central chest pain onset yesterday worse with deep inspiration , mild SOB with nausea and diaphoresis .

## 2018-09-18 NOTE — ED Provider Notes (Signed)
[facility] Emergency Department Provider Note MRN:  [id number]  Arrival date & time: 09/18/18     Chief Complaint   Chest pain History of Present Illness   Julie Fitzpatrick is a 28 y.o. year-old female with a history of asthma, GERD presenting to the ED with chief complaint of chest pain.  Pain began suddenly yesterday afternoon, located in the center of the chest, described as a sharp pain, associated with shortness of breath, worse with deep breathing, worse with palpation, associated with dizziness, diaphoresis.  Denies leg pain or swelling, no fever, no cough, no abdominal pain.  Pain is moderate in severity.  Review of Systems  A complete 10 system review of systems was obtained and all systems are negative except as noted in the HPI and PMH.   Patient's Health History    Past Medical History:  Diagnosis Date  . Asthma   . GERD (gastroesophageal reflux disease)     Past Surgical History:  Procedure Laterality Date  . ARTHROSCOPIC REPAIR ACL    . WISDOM TOOTH EXTRACTION      History reviewed. No pertinent family history.  Social History   Socioeconomic History  . Marital status: Single    Spouse name: Not on file  . Number of children: Not on file  . Years of education: Not on file  . Highest education level: Not on file  Occupational History  . Not on file  Social Needs  . Financial resource strain: Not on file  . Food insecurity:    Worry: Not on file    Inability: Not on file  . Transportation needs:    Medical: Not on file    Non-medical: Not on file  Tobacco Use  . Smoking status: Never Smoker  . Smokeless tobacco: Never Used  Substance and Sexual Activity  . Alcohol use: No  . Drug use: No  . Sexual activity: Not on file  Lifestyle  . Physical activity:    Days per week: Not on file    Minutes per session: Not on file  . Stress: Not on file  Relationships  . Social connections:    Talks on phone: Not on file    Gets together:  Not on file    Attends religious service: Not on file    Active member of club or organization: Not on file    Attends meetings of clubs or organizations: Not on file    Relationship status: Not on file  . Intimate partner violence:    Fear of current or ex partner: Not on file    Emotionally abused: Not on file    Physically abused: Not on file    Forced sexual activity: Not on file  Other Topics Concern  . Not on file  Social History Narrative  . Not on file     Physical Exam  Vital Signs and Nursing Notes reviewed Vitals:   09/18/18 0619 09/18/18 0645  BP: 121/81 116/88  Pulse: 71 67  Resp: 16 12  Temp:    SpO2: 100% 100%    CONSTITUTIONAL: Well-appearing, NAD NEURO:  Alert and oriented x 3, no focal deficits EYES:  eyes equal and reactive ENT/NECK:  no LAD, no JVD CARDIO: Regular rate, well-perfused, normal S1 and S2; tender to palpation to the central chest PULM:  CTAB no wheezing or rhonchi GI/GU:  normal bowel sounds, non-distended, non-tender MSK/SPINE:  No gross deformities, no edema SKIN:  no rash, atraumatic PSYCH:  Appropriate speech and  behavior  Diagnostic and Interventional Summary    EKG Interpretation  Date/Time:  Thursday September 18 2018 05:12:15 EST Ventricular Rate:  78 PR Interval:    QRS Duration: 74 QT Interval:  368 QTC Calculation: 420 R Axis:   40 Text Interpretation:  Sinus rhythm Probable left atrial enlargement Baseline wander in lead(s) I III aVL aVF Confirmed by Kennis Carina [phone]) on 09/18/2018 5:17:38 AM      Labs Reviewed  I-STAT BETA HCG BLOOD, ED ([facility], [facility], [facility] ONLY)  I-STAT TROPONIN, ED    DG Chest 2 View  Final Result      Medications  ketorolac (TORADOL) 15 MG/ML injection 15 mg (15 mg Intravenous Given 09/18/18 0604)     Procedures Critical Care  ED Course and Medical Decision Making  I have reviewed the triage vital signs and the nursing notes.  Pertinent labs & imaging results that were available during my  care of the patient were reviewed by me and considered in my medical decision making (see below for details).  Favoring musculoskeletal pain in this 28 year old female with history of mild asthma here with sharp central chest pain that is reproducible on exam.  Patient is PERC negative, EKG is unremarkable, chest x-ray pending.  Chest x-ray unremarkable, troponin negative.  MSK most likely, advised NSAIDs.  After the discussed management above, the patient was determined to be safe for discharge.  The patient was in agreement with this plan and all questions regarding their care were answered.  ED return precautions were discussed and the patient will return to the ED with any significant worsening of condition.  Elmer Sow. Pilar Plate, MD [facility] Emergency Medicine [facility] [email or web address]  Final Clinical Impressions(s) / ED Diagnoses     ICD-10-CM   1. Chest pain R07.9 DG Chest 2 View    DG Chest 2 View    ED Discharge Orders    None         Sabas Sous, MD 09/18/18 [phone]

## 2018-09-19 ENCOUNTER — Telehealth: Payer: Self-pay | Admitting: *Deleted

## 2018-09-19 NOTE — Telephone Encounter (Signed)
Pt called stating her Rx was not faxed to pharmacy as instructed.  EDCM reviewed chart to find that no Rx was written.  EDCM explained this to pt and advised to follow discharge instructions on AVS.

## 2019-01-15 ENCOUNTER — Other Ambulatory Visit: Payer: Self-pay

## 2019-01-15 ENCOUNTER — Encounter (HOSPITAL_COMMUNITY): Payer: Self-pay

## 2019-01-15 ENCOUNTER — Emergency Department (HOSPITAL_COMMUNITY)
Admission: EM | Admit: 2019-01-15 | Discharge: 2019-01-15 | Disposition: A | Discharge status: Home / Self Care | Payer: HRSA Program | Attending: Emergency Medicine | Admitting: Emergency Medicine

## 2019-01-15 DIAGNOSIS — B349 Viral infection, unspecified: Secondary | ICD-10-CM

## 2019-01-15 DIAGNOSIS — U071 COVID-19: Secondary | ICD-10-CM | POA: Insufficient documentation

## 2019-01-15 DIAGNOSIS — Z20822 Contact with and (suspected) exposure to covid-19: Secondary | ICD-10-CM

## 2019-01-15 DIAGNOSIS — J45909 Unspecified asthma, uncomplicated: Secondary | ICD-10-CM | POA: Diagnosis not present

## 2019-01-15 DIAGNOSIS — R0602 Shortness of breath: Secondary | ICD-10-CM | POA: Diagnosis present

## 2019-01-15 MED ORDER — ONDANSETRON 4 MG PO TBDP
4.0000 mg | ORAL_TABLET | Freq: Once | ORAL | Status: AC
  Administered 2019-01-15: 4 mg via ORAL
  Filled 2019-01-15: qty 1

## 2019-01-15 MED ORDER — ACETAMINOPHEN 500 MG PO TABS
1000.0000 mg | ORAL_TABLET | Freq: Once | ORAL | Status: AC
  Administered 2019-01-15: 1000 mg via ORAL
  Filled 2019-01-15: qty 2

## 2019-01-15 NOTE — Discharge Instructions (Signed)
Person Under Monitoring Name: Julie Fitzpatrick  Location: [address] [place] [address]   Infection Prevention Recommendations for Individuals Confirmed to have, or Being Evaluated for, 2019 Novel Coronavirus (COVID-19) Infection Who Receive Care at Home  Individuals who are confirmed to have, or are being evaluated for, COVID-19 should follow the prevention steps below until a healthcare provider or local or state health department says they can return to normal activities.  Stay home except to get medical care You should restrict activities outside your home, except for getting medical care. Do not go to work, school, or public areas, and do not use public transportation or taxis.  Call ahead before visiting your doctor Before your medical appointment, call the healthcare provider and tell them that you have, or are being evaluated for, COVID-19 infection. This will help the healthcare providers office take steps to keep other people from getting infected. Ask your healthcare provider to call the local or state health department.  Monitor your symptoms Seek prompt medical attention if your illness is worsening (e.g., difficulty breathing). Before going to your medical appointment, call the healthcare provider and tell them that you have, or are being evaluated for, COVID-19 infection. Ask your healthcare provider to call the local or state health department.  Wear a facemask You should wear a facemask that covers your nose and mouth when you are in the same room with other people and when you visit a healthcare provider. People who live with or visit you should also wear a facemask while they are in the same room with you.  Separate yourself from other people in your home As much as possible, you should stay in a different room from other people in your home. Also, you should use a separate bathroom, if available.  Avoid sharing household items You should not  share dishes, drinking glasses, cups, eating utensils, towels, bedding, or other items with other people in your home. After using these items, you should wash them thoroughly with soap and water.  Cover your coughs and sneezes Cover your mouth and nose with a tissue when you cough or sneeze, or you can cough or sneeze into your sleeve. Throw used tissues in a lined trash can, and immediately wash your hands with soap and water for at least 20 seconds or use an alcohol-based hand rub.  Wash your [employer] your hands often and thoroughly with soap and water for at least 20 seconds. You can use an alcohol-based hand sanitizer if soap and water are not available and if your hands are not visibly dirty. Avoid touching your eyes, nose, and mouth with unwashed hands.   Prevention Steps for Caregivers and Household Members of Individuals Confirmed to have, or Being Evaluated for, COVID-19 Infection Being Cared for in the Home  If you live with, or provide care at home for, a person confirmed to have, or being evaluated for, COVID-19 infection please follow these guidelines to prevent infection:  Follow healthcare providers instructions Make sure that you understand and can help the patient follow any healthcare provider instructions for all care.  Provide for the patients basic needs You should help the patient with basic needs in the home and provide support for getting groceries, prescriptions, and other personal needs.  Monitor the patients symptoms If they are getting sicker, call his or her medical provider and tell them that the patient has, or is being evaluated for, COVID-19 infection. This will help the healthcare providers office  take steps to keep other people from getting infected. Ask the healthcare provider to call the local or state health department.  Limit the number of people who have contact with the patient If possible, have only one caregiver for the  patient. Other household members should stay in another home or place of residence. If this is not possible, they should stay in another room, or be separated from the patient as much as possible. Use a separate bathroom, if available. Restrict visitors who do not have an essential need to be in the home.  Keep older adults, very young children, and other sick people away from the patient Keep older adults, very young children, and those who have compromised immune systems or chronic health conditions away from the patient. This includes people with chronic heart, lung, or kidney conditions, diabetes, and cancer.  Ensure good ventilation Make sure that shared spaces in the home have good air flow, such as from an air conditioner or an opened window, weather permitting.  Wash your hands often Wash your hands often and thoroughly with soap and water for at least 20 seconds. You can use an alcohol based hand sanitizer if soap and water are not available and if your hands are not visibly dirty. Avoid touching your eyes, nose, and mouth with unwashed hands. Use disposable paper towels to dry your hands. If not available, use dedicated cloth towels and replace them when they become wet.  Wear a facemask and gloves Wear a disposable facemask at all times in the room and gloves when you touch or have contact with the patients blood, body fluids, and/or secretions or excretions, such as sweat, saliva, sputum, nasal mucus, vomit, urine, or feces.  Ensure the mask fits over your nose and mouth tightly, and do not touch it during use. Throw out disposable facemasks and gloves after using them. Do not reuse. Wash your hands immediately after removing your facemask and gloves. If your personal clothing becomes contaminated, carefully remove clothing and launder. Wash your hands after handling contaminated clothing. Place all used disposable facemasks, gloves, and other waste in a lined container before  disposing them with other household waste. Remove gloves and wash your hands immediately after handling these items.  Do not share dishes, glasses, or other household items with the patient Avoid sharing household items. You should not share dishes, drinking glasses, cups, eating utensils, towels, bedding, or other items with a patient who is confirmed to have, or being evaluated for, COVID-19 infection. After the person uses these items, you should wash them thoroughly with soap and water.  Wash laundry thoroughly Immediately remove and wash clothes or bedding that have blood, body fluids, and/or secretions or excretions, such as sweat, saliva, sputum, nasal mucus, vomit, urine, or feces, on them. Wear gloves when handling laundry from the patient. Read and follow directions on labels of laundry or clothing items and detergent. In general, wash and dry with the warmest temperatures recommended on the label.  Clean all areas the individual has used often Clean all touchable surfaces, such as counters, tabletops, doorknobs, bathroom fixtures, toilets, phones, keyboards, tablets, and bedside tables, every day. Also, clean any surfaces that may have blood, body fluids, and/or secretions or excretions on them. Wear gloves when cleaning surfaces the patient has come in contact with. Use a diluted bleach solution (e.g., dilute bleach with 1 part bleach and 10 parts water) or a household disinfectant with a label that says EPA-registered for coronaviruses. To make a bleach  solution at home, add 1 tablespoon of bleach to 1 quart (4 cups) of water. For a larger supply, add  cup of bleach to 1 gallon (16 cups) of water. Read labels of cleaning products and follow recommendations provided on product labels. Labels contain instructions for safe and effective use of the cleaning product including precautions you should take when applying the product, such as wearing gloves or eye protection and making sure you  have good ventilation during use of the product. Remove gloves and wash hands immediately after cleaning.  Monitor yourself for signs and symptoms of illness Caregivers and household members are considered close contacts, should monitor their health, and will be asked to limit movement outside of the home to the extent possible. Follow the monitoring steps for close contacts listed on the symptom monitoring form.   ? If you have additional questions, contact your local health department or call the epidemiologist on call at [phone] (available 24/7). ? This guidance is subject to change. For the most up-to-date guidance from [facility], please refer to their website: [email or web address]

## 2019-01-15 NOTE — ED Notes (Signed)
Bed: WA17 Expected date:  Expected time:  Means of arrival:  Comments: Ems 28 yo f sob

## 2019-01-15 NOTE — ED Provider Notes (Signed)
[facility]-EMERGENCY DEPT Provider Note  CSN: [id number] Arrival date & time: 01/15/19 0255  Chief Complaint(s) Shortness of Breath  HPI Julie Fitzpatrick is a 28 y.o. female   The history is provided by the patient.  Illness  Severity:  Moderate Onset quality:  Gradual Duration: several days. Timing:  Constant Progression:  Worsening Chronicity:  New Associated symptoms: congestion, cough, fatigue, headaches, myalgias, nausea, rhinorrhea, shortness of breath and vomiting (NBNB)   Associated symptoms: no abdominal pain, no diarrhea and no fever   Headaches:    Severity:  Severe   Onset quality:  Gradual   Duration:  1 day   Timing:  Constant   Progression:  Waxing and waning   Chronicity:  New worse with coughing. No alleviating factors. Has been taking OTC. Last took meds yesterday morning.  Patient denies any known sick contacts.  States that she is still working but they are distancing and using PPE.   Past Medical History Past Medical History:  Diagnosis Date   Asthma    GERD (gastroesophageal reflux disease)    There are no active problems to display for this patient.  Home Medication(s) Prior to Admission medications   Medication Sig Start Date End Date Taking? Authorizing Provider  acetaminophen (TYLENOL) 500 MG tablet Take 1,000 mg by mouth every 6 (six) hours as needed for mild pain.   Yes [provider]  docusate sodium (COLACE) 100 MG capsule Take 1 capsule (100 mg total) by mouth every 12 (twelve) hours. Patient not taking: Reported on 09/18/2018 10/07/16   Horton, Mayer Masker, MD  fluticasone [facility]) 50 MCG/ACT nasal spray Place 2 sprays into both nostrils daily. Patient not taking: Reported on 10/06/2016 06/01/15   Street, Jeffers, PA-C  hydrocortisone (ANUSOL-HC) 2.5 % rectal cream Apply rectally 2 times daily Patient not taking: Reported on 09/18/2018 10/07/16   Horton, Mayer Masker, MD                                                                                          Past Surgical History Past Surgical History:  Procedure Laterality Date   ARTHROSCOPIC REPAIR ACL     WISDOM TOOTH EXTRACTION     Family History History reviewed. No pertinent family history.  Social History Social History   Tobacco Use   Smoking status: Never Smoker   Smokeless tobacco: Never Used  Substance Use Topics   Alcohol use: No   Drug use: No   Allergies Patient has no known allergies.  Review of Systems Review of Systems  Constitutional: Positive for fatigue. Negative for fever.  HENT: Positive for congestion and rhinorrhea.   Respiratory: Positive for cough and shortness of breath.   Gastrointestinal: Positive for nausea and vomiting (NBNB). Negative for abdominal pain and diarrhea.  Musculoskeletal: Positive for myalgias.  Neurological: Positive for headaches.   All other systems are reviewed and are negative for acute change except as noted in the HPI  Physical Exam Vital Signs  I have reviewed the triage vital signs BP 126/73 (BP Location: Right Arm)    Pulse 90    Temp (!) 101.2 F (38.4 C) (Rectal)  Resp 22    Ht 5\' 3"  (1.6 m)    Wt 81.6 kg    SpO2 100%    BMI 31.89 kg/m   Physical Exam Vitals signs reviewed.  Constitutional:      General: She is not in acute distress.    Appearance: She is well-developed. She is not diaphoretic.  HENT:     Head: Normocephalic and atraumatic.     Right Ear: Tympanic membrane normal.     Nose: Mucosal edema, congestion and rhinorrhea present. Rhinorrhea is clear.     Right Sinus: Frontal sinus tenderness present.     Left Sinus: Frontal sinus tenderness present.     Mouth/Throat:     Tongue: No lesions.     Palate: No lesions.     Pharynx: No pharyngeal swelling, oropharyngeal exudate, posterior oropharyngeal erythema or uvula swelling.     Tonsils: No tonsillar exudate or tonsillar abscesses.     Comments: Post nasal  drip  Eyes:     General: No scleral icterus.       Right eye: No discharge.        Left eye: No discharge.     Conjunctiva/sclera: Conjunctivae normal.     Pupils: Pupils are equal, round, and reactive to light.  Neck:     Musculoskeletal: Normal range of motion and neck supple.  Cardiovascular:     Rate and Rhythm: Normal rate and regular rhythm.     Heart sounds: No murmur. No friction rub. No gallop.   Pulmonary:     Effort: Pulmonary effort is normal. Tachypnea present. No respiratory distress.     Breath sounds: Normal breath sounds. No stridor. No wheezing, rhonchi or rales.  Abdominal:     General: There is no distension.     Palpations: Abdomen is soft.     Tenderness: There is no abdominal tenderness.  Musculoskeletal:        General: No tenderness.  Skin:    General: Skin is warm and dry.     Findings: No erythema or rash.  Neurological:     Mental Status: She is alert and oriented to person, place, and time.     ED Results and Treatments Labs (all labs ordered are listed, but only abnormal results are displayed) Labs Reviewed  NOVEL CORONAVIRUS, NAA (HOSPITAL ORDER, SEND-OUT TO REF LAB)                                                                                                                         EKG  EKG Interpretation  Date/Time:    Ventricular Rate:    PR Interval:    QRS Duration:   QT Interval:    QTC Calculation:   R Axis:     Text Interpretation:        Radiology No results found. Pertinent labs & imaging results that were available during my care of the patient were reviewed by me and considered in my medical decision making (see  chart for details).  Medications Ordered in ED Medications  acetaminophen (TYLENOL) tablet 1,000 mg (1,000 mg Oral Given 01/15/19 0354)  ondansetron (ZOFRAN-ODT) disintegrating tablet 4 mg (4 mg Oral Given 01/15/19 0354)                                                                                                                                     Procedures Procedures  (including critical care time)  Medical Decision Making / ED Course I have reviewed the nursing notes for this encounter and the patient's prior records (if available in EHR or on provided paperwork).      Julie Fitzpatrick was evaluated in Emergency Department on 01/15/2019 for the symptoms described in the history of present illness. She was evaluated in the context of the global COVID-19 pandemic, which necessitated consideration that the patient might be at risk for infection with the SARS-CoV-2 virus that causes COVID-19. Institutional protocols and algorithms that pertain to the evaluation of patients at risk for COVID-19 are in a state of rapid change based on information released by regulatory bodies including the CDC and federal and state organizations. These policies and algorithms were followed during the patient's care in the ED.  Headache and myalgias significantly improve with Tylenol.  The patient is safe for discharge with strict return precautions.    Final Clinical Impression(s) / ED Diagnoses Final diagnoses:  Viral infection  Suspected Covid-19 Virus Infection   Disposition: Discharge  Condition: Good  I have discussed the results, Dx and Tx plan with the patient who expressed understanding and agree(s) with the plan. Discharge instructions discussed at great length. The patient was given strict return precautions who verbalized understanding of the instructions. No further questions at time of discharge.    ED Discharge Orders    None       Follow Up: Primary care provider  Schedule an appointment as soon as possible for a visit        This chart was dictated using voice recognition software.  Despite best efforts to proofread,  errors can occur which can change the documentation meaning.   Nira Conn, MD 01/15/19 [phone]

## 2019-01-15 NOTE — ED Triage Notes (Addendum)
Per EMS, patient coming from home with complaints of chills without fever, cough, headache, and shortness of breath. Cough started a few days ago but the shortness of breath and headache started about an hour ago. Patient has a history of asthma and has not used her inhaler today.

## 2019-01-16 ENCOUNTER — Telehealth: Payer: Self-pay

## 2019-01-16 NOTE — Telephone Encounter (Signed)
Patient called and says she saw her Covid test results on MyChart and it said detected and not detected in the reference range. She asked what does that mean. I opened up the results and advised detected means positive and a provider from [facility] team will contact her to discuss further, advised to be in self-isolation x 14 days, she verbalized understanding.

## 2019-01-16 NOTE — Telephone Encounter (Signed)
Patient called back to let her know to call the [facility]ED about her positive test results, I called the ED for the patient to connect the call and was told by Kennyth Arnold, RN that they do not call the patient's back for positive results and to let her know to follow the instructions given to her at discharge about isolation and worsening of symptoms. I advised the patient, she verbalized understanding.

## 2019-01-17 LAB — NOVEL CORONAVIRUS, NAA (HOSP ORDER, SEND-OUT TO REF LAB; TAT 18-24 HRS): SARS-CoV-2, NAA: DETECTED — AB

## 2019-01-20 ENCOUNTER — Emergency Department (HOSPITAL_COMMUNITY): Payer: Self-pay

## 2019-01-20 ENCOUNTER — Encounter (HOSPITAL_COMMUNITY): Payer: Self-pay

## 2019-01-20 ENCOUNTER — Emergency Department (HOSPITAL_COMMUNITY)
Admission: EM | Admit: 2019-01-20 | Discharge: 2019-01-20 | Disposition: A | Discharge status: Home / Self Care | Payer: Self-pay | Attending: Emergency Medicine | Admitting: Emergency Medicine

## 2019-01-20 DIAGNOSIS — U071 COVID-19: Secondary | ICD-10-CM | POA: Insufficient documentation

## 2019-01-20 DIAGNOSIS — E860 Dehydration: Secondary | ICD-10-CM | POA: Insufficient documentation

## 2019-01-20 DIAGNOSIS — J45909 Unspecified asthma, uncomplicated: Secondary | ICD-10-CM | POA: Insufficient documentation

## 2019-01-20 DIAGNOSIS — A0839 Other viral enteritis: Secondary | ICD-10-CM | POA: Insufficient documentation

## 2019-01-20 DIAGNOSIS — R509 Fever, unspecified: Secondary | ICD-10-CM | POA: Insufficient documentation

## 2019-01-20 LAB — CBC
HCT: 39.9 % (ref 36.0–46.0)
Hemoglobin: 13.4 g/dL (ref 12.0–15.0)
MCH: 29.2 pg (ref 26.0–34.0)
MCHC: 33.6 g/dL (ref 30.0–36.0)
MCV: 86.9 fL (ref 80.0–100.0)
Platelets: 188 10*3/uL (ref 150–400)
RBC: 4.59 MIL/uL (ref 3.87–5.11)
RDW: 13.2 % (ref 11.5–15.5)
WBC: 4.7 10*3/uL (ref 4.0–10.5)
nRBC: 0 % (ref 0.0–0.2)

## 2019-01-20 LAB — I-STAT BETA HCG BLOOD, ED (MC, WL, AP ONLY): I-stat hCG, quantitative: <5 m[IU]/mL (ref <5)

## 2019-01-20 LAB — COMPREHENSIVE METABOLIC PANEL
ALT: 17 U/L (ref 0–44)
AST: 31 U/L (ref 15–41)
Albumin: 4.1 g/dL (ref 3.5–5.0)
Alkaline Phosphatase: 52 U/L (ref 38–126)
Anion gap: 11 (ref 5–15)
BUN: 7 mg/dL (ref 6–20)
CO2: 22 mmol/L (ref 22–32)
Calcium: 8.7 mg/dL — ABNORMAL LOW (ref 8.9–10.3)
Chloride: 104 mmol/L (ref 98–111)
Creatinine, Ser: 0.91 mg/dL (ref 0.44–1.00)
GFR calc Af Amer: >60 mL/min (ref >60)
GFR calc non Af Amer: >60 mL/min (ref >60)
Glucose, Bld: 96 mg/dL (ref 70–99)
Potassium: 3.6 mmol/L (ref 3.5–5.1)
Sodium: 137 mmol/L (ref 135–145)
Total Bilirubin: 0.3 mg/dL (ref 0.3–1.2)
Total Protein: 8.2 g/dL — ABNORMAL HIGH (ref 6.5–8.1)

## 2019-01-20 LAB — URINALYSIS, ROUTINE W REFLEX MICROSCOPIC
Bilirubin Urine: NEGATIVE
Glucose, UA: NEGATIVE mg/dL
Hgb urine dipstick: NEGATIVE
Ketones, ur: 80 mg/dL — AB
Nitrite: NEGATIVE
Protein, ur: 100 mg/dL — AB
Specific Gravity, Urine: 1.027 (ref 1.005–1.030)
pH: 7 (ref 5.0–8.0)

## 2019-01-20 LAB — LIPASE, BLOOD: Lipase: 37 U/L (ref 11–51)

## 2019-01-20 MED ORDER — ONDANSETRON 4 MG PO TBDP: 4.0000 mg | ORAL_TABLET | Freq: Three times a day (TID) | ORAL | 0 refills | Status: AC | PRN

## 2019-01-20 MED ORDER — MORPHINE SULFATE (PF) 4 MG/ML IV SOLN
4.0000 mg | Freq: Once | INTRAVENOUS | Status: AC
  Administered 2019-01-20: 4 mg via INTRAVENOUS
  Filled 2019-01-20: qty 1

## 2019-01-20 MED ORDER — ONDANSETRON HCL 4 MG/2ML IJ SOLN
4.0000 mg | Freq: Once | INTRAMUSCULAR | Status: AC
  Administered 2019-01-20: 4 mg via INTRAVENOUS
  Filled 2019-01-20: qty 2

## 2019-01-20 MED ORDER — PROMETHAZINE HCL 25 MG/ML IJ SOLN
12.5000 mg | Freq: Once | INTRAMUSCULAR | Status: AC
  Administered 2019-01-20: 12.5 mg via INTRAVENOUS
  Filled 2019-01-20: qty 1

## 2019-01-20 MED ORDER — HYDROCODONE-ACETAMINOPHEN 5-325 MG PO TABS
1.0000 | ORAL_TABLET | Freq: Once | ORAL | Status: AC
  Administered 2019-01-20: 1 via ORAL
  Filled 2019-01-20: qty 1

## 2019-01-20 MED ORDER — SODIUM CHLORIDE 0.9 % IV BOLUS
1000.0000 mL | Freq: Once | INTRAVENOUS | Status: AC
  Administered 2019-01-20: 1000 mL via INTRAVENOUS

## 2019-01-20 MED ORDER — KETOROLAC TROMETHAMINE 30 MG/ML IJ SOLN
30.0000 mg | Freq: Once | INTRAMUSCULAR | Status: AC
  Administered 2019-01-20: 30 mg via INTRAVENOUS
  Filled 2019-01-20: qty 1

## 2019-01-20 MED ORDER — IBUPROFEN 800 MG PO TABS
800.0000 mg | ORAL_TABLET | Freq: Once | ORAL | Status: AC
  Administered 2019-01-20: 800 mg via ORAL
  Filled 2019-01-20: qty 1

## 2019-01-20 MED ORDER — HYDROCODONE-ACETAMINOPHEN 5-325 MG PO TABS: 1.0000 | ORAL_TABLET | ORAL | 0 refills | Status: AC | PRN

## 2019-01-20 MED ORDER — SODIUM CHLORIDE 0.9% FLUSH
3.0000 mL | Freq: Once | INTRAVENOUS | Status: AC
  Administered 2019-01-20: 3 mL via INTRAVENOUS

## 2019-01-20 MED ORDER — IBUPROFEN 600 MG PO TABS: 600.0000 mg | ORAL_TABLET | Freq: Four times a day (QID) | ORAL | 0 refills | Status: AC | PRN

## 2019-01-20 NOTE — ED Notes (Signed)
Patient notified of DC, patient is going to call a ride and call out when the ride is here.

## 2019-01-20 NOTE — ED Notes (Signed)
Bedside commode in room and reachable. Patient aware we need UA. Patient will call out once voided.

## 2019-01-20 NOTE — ED Triage Notes (Signed)
Patient arrived via [facility] from home.  Called out today for vomiting blood twice in past 30 min. Patient also vomited once with emesis.   Also C/O generalized body aches.   Patient states she ate strawberries, cherries, and pineapples.   Patient took tylenol at 1200 today.   Covid + on Friday.   A/Ox4 Ambulatory with EMS.

## 2019-01-20 NOTE — ED Notes (Signed)
Bed: OE70 Expected date:  Expected time:  Means of arrival:  Comments: EMS-emesis/+COVID

## 2019-01-20 NOTE — ED Provider Notes (Signed)
[facility]-EMERGENCY DEPT Provider Note   CSN: [id number] Arrival date & time: 01/20/19  1544    History   Chief Complaint Chief Complaint  Patient presents with  . Emesis    COVID +    HPI Julie Fitzpatrick is a 28 y.o. female.     Pt presents to the ED today with n/v.  The pt was seen in the ED on 6/4 and was diagnosed with Covid.  She has had n/v since then.  She said today was worse.  She has had fevers for which she last took tylenol at 1200.  She thinks she threw it up.  The pt said her breathing has been ok.     Past Medical History:  Diagnosis Date  . Asthma   . GERD (gastroesophageal reflux disease)     There are no active problems to display for this patient.   Past Surgical History:  Procedure Laterality Date  . ARTHROSCOPIC REPAIR ACL    . WISDOM TOOTH EXTRACTION       OB History   No obstetric history on file.      Home Medications    Prior to Admission medications   Medication Sig Start Date End Date Taking? Authorizing Provider  acetaminophen (TYLENOL) 500 MG tablet Take 1,000 mg by mouth every 6 (six) hours as needed for mild pain.    [provider]  docusate sodium (COLACE) 100 MG capsule Take 1 capsule (100 mg total) by mouth every 12 (twelve) hours. Patient not taking: Reported on 09/18/2018 10/07/16   Horton, Mayer Maskerourtney F, MD  fluticasone [facility](FLONASE) 50 MCG/ACT nasal spray Place 2 sprays into both nostrils daily. Patient not taking: Reported on 10/06/2016 06/01/15   Street, [place], [place]  HYDROcodone-acetaminophen (NORCO/VICODIN) 5-325 MG tablet Take 1 tablet by mouth every 4 (four) hours as needed. 01/20/19   Jacalyn LefevreHaviland, Acire Tang, MD  hydrocortisone (ANUSOL-HC) 2.5 % rectal cream Apply rectally 2 times daily Patient not taking: Reported on 09/18/2018 10/07/16   Horton, Mayer Maskerourtney F, MD  ibuprofen (ADVIL) 600 MG tablet Take 1 tablet (600 mg total) by mouth every 6 (six) hours as needed. 01/20/19   Jacalyn LefevreHaviland, Monea Pesantez, MD  ondansetron  (ZOFRAN ODT) 4 MG disintegrating tablet Take 1 tablet (4 mg total) by mouth every 8 (eight) hours as needed. 01/20/19   Jacalyn LefevreHaviland, Kateryna Grantham, MD    Family History No family history on file.  Social History Social History   Tobacco Use  . Smoking status: Never Smoker  . Smokeless tobacco: Never Used  Substance Use Topics  . Alcohol use: No  . Drug use: No     Allergies   Patient has no known allergies.   Review of Systems Review of Systems  Constitutional: Positive for chills and fever.  Gastrointestinal: Positive for nausea and vomiting.  Neurological: Positive for weakness.  All other systems reviewed and are negative.    Physical Exam Updated Vital Signs BP 100/62 (BP Location: Left Arm)   Pulse 65   Temp (!) 100.9 F (38.3 C) (Oral)   Resp 16   LMP 01/07/2019   SpO2 98%   Physical Exam Vitals signs and nursing note reviewed.  Constitutional:      Appearance: Normal appearance.  HENT:     Head: Normocephalic and atraumatic.     Right Ear: External ear normal.     Left Ear: External ear normal.     Nose: Nose normal.     Mouth/Throat:     Mouth: Mucous membranes  are dry.  Eyes:     Extraocular Movements: Extraocular movements intact.     Conjunctiva/sclera: Conjunctivae normal.     Pupils: Pupils are equal, round, and reactive to light.  Neck:     Musculoskeletal: Normal range of motion and neck supple.  Cardiovascular:     Rate and Rhythm: Normal rate and regular rhythm.     Pulses: Normal pulses.     Heart sounds: Normal heart sounds.  Pulmonary:     Effort: Pulmonary effort is normal.     Breath sounds: Normal breath sounds.  Abdominal:     General: Abdomen is flat. Bowel sounds are normal.     Palpations: Abdomen is soft.  Musculoskeletal: Normal range of motion.  Skin:    General: Skin is warm.     Capillary Refill: Capillary refill takes less than 2 seconds.  Neurological:     General: No focal deficit present.     Mental Status: She is alert  and oriented to person, place, and time.  Psychiatric:        Mood and Affect: Mood normal.        Behavior: Behavior normal.        Thought Content: Thought content normal.        Judgment: Judgment normal.      ED Treatments / Results  Labs (all labs ordered are listed, but only abnormal results are displayed) Labs Reviewed  COMPREHENSIVE METABOLIC PANEL - Abnormal; Notable for the following components:      Result Value   Calcium 8.7 (*)    Total Protein 8.2 (*)    All other components within normal limits  URINALYSIS, ROUTINE W REFLEX MICROSCOPIC - Abnormal; Notable for the following components:   APPearance HAZY (*)    Ketones, ur 80 (*)    Protein, ur 100 (*)    Leukocytes,Ua TRACE (*)    Bacteria, UA RARE (*)    All other components within normal limits  LIPASE, BLOOD  CBC  I-STAT BETA HCG BLOOD, ED ([facility], [facility], [facility] ONLY)    EKG None  Radiology Dg Chest Portable 1 View  Result Date: 01/20/2019 CLINICAL DATA:  Shortness of breath, vomiting. EXAM: PORTABLE CHEST 1 VIEW COMPARISON:  Radiographs of September 18, 2018. FINDINGS: The heart size and mediastinal contours are within normal limits. Lungs are clear. No pneumothorax or pleural effusion is noted. The visualized skeletal structures are unremarkable. IMPRESSION: No active disease. Electronically Signed   By: Marijo Conception M.D.   On: 01/20/2019 17:14    Procedures Procedures (including critical care time)  Medications Ordered in ED Medications  HYDROcodone-acetaminophen (NORCO/VICODIN) 5-325 MG per tablet 1 tablet (has no administration in time range)  ibuprofen (ADVIL) tablet 800 mg (has no administration in time range)  sodium chloride flush (NS) 0.9 % injection 3 mL (3 mLs Intravenous Given 01/20/19 1617)  sodium chloride 0.9 % bolus 1,000 mL (0 mLs Intravenous Stopped 01/20/19 1720)  ondansetron (ZOFRAN) injection 4 mg (4 mg Intravenous Given 01/20/19 1616)  ketorolac (TORADOL) 30 MG/ML injection 30 mg (30 mg  Intravenous Given 01/20/19 1616)  morphine 4 MG/ML injection 4 mg (4 mg Intravenous Given 01/20/19 1806)  promethazine (PHENERGAN) injection 12.5 mg (12.5 mg Intravenous Given 01/20/19 1806)     Initial Impression / Assessment and Plan / ED Course  I have reviewed the triage vital signs and the nursing notes.  Pertinent labs & imaging results that were available during my care of the patient were reviewed  by me and considered in my medical decision making (see chart for details).   Pt is feeling much better after IVFs.  The pt has been able to keep down po fluids.  She is instructed to continue to isolate herself.  She will be d/c home with lortab and ibuprofen and zofran.  Return if worse.    Julie Fitzpatrick was evaluated in Emergency Department on 01/20/2019 for the symptoms described in the history of present illness. She was evaluated in the context of the global COVID-19 pandemic, which necessitated consideration that the patient might be at risk for infection with the SARS-CoV-2 virus that causes COVID-19. Institutional protocols and algorithms that pertain to the evaluation of patients at risk for COVID-19 are in a state of rapid change based on information released by regulatory bodies including the CDC and federal and state organizations. These policies and algorithms were followed during the patient's care in the ED.  Final Clinical Impressions(s) / ED Diagnoses   Final diagnoses:  Dehydration  Gastroenteritis due to COVID-19 virus    ED Discharge Orders         Ordered    ondansetron (ZOFRAN ODT) 4 MG disintegrating tablet  Every 8 hours PRN     01/20/19 2005    ibuprofen (ADVIL) 600 MG tablet  Every 6 hours PRN     01/20/19 2006    HYDROcodone-acetaminophen (NORCO/VICODIN) 5-325 MG tablet  Every 4 hours PRN     01/20/19 2006           Jacalyn LefevreHaviland, Halaina Vanduzer, MD 01/20/19 2010

## 2019-01-20 NOTE — ED Notes (Signed)
Patient given crackers and water for PO challenge

## 2019-02-17 ENCOUNTER — Telehealth: Payer: Self-pay | Admitting: *Deleted

## 2019-02-17 DIAGNOSIS — Z20822 Contact with and (suspected) exposure to covid-19: Secondary | ICD-10-CM

## 2019-02-17 NOTE — Telephone Encounter (Signed)
Pt referred for testing by Charletta Cousin. Pt was previously positive for COVID-19. Request for retest. Pt called and scheduled for testing on 02/18/19 at the [facility] site. Pt advised to wear a mask and to remain in car at scheduled appt time. Understanding verbalized.

## 2019-02-18 ENCOUNTER — Other Ambulatory Visit: Payer: Self-pay

## 2019-02-18 DIAGNOSIS — Z20822 Contact with and (suspected) exposure to covid-19: Secondary | ICD-10-CM

## 2019-02-23 LAB — NOVEL CORONAVIRUS, NAA: SARS-CoV-2, NAA: NOT DETECTED

## 2020-01-06 ENCOUNTER — Encounter (HOSPITAL_COMMUNITY): Payer: Self-pay

## 2020-01-06 ENCOUNTER — Emergency Department (HOSPITAL_COMMUNITY): Payer: 59

## 2020-01-06 ENCOUNTER — Other Ambulatory Visit: Payer: Self-pay

## 2020-01-06 DIAGNOSIS — R509 Fever, unspecified: Secondary | ICD-10-CM | POA: Insufficient documentation

## 2020-01-06 DIAGNOSIS — B349 Viral infection, unspecified: Secondary | ICD-10-CM | POA: Diagnosis not present

## 2020-01-06 DIAGNOSIS — R519 Headache, unspecified: Secondary | ICD-10-CM | POA: Insufficient documentation

## 2020-01-06 DIAGNOSIS — R0789 Other chest pain: Secondary | ICD-10-CM | POA: Insufficient documentation

## 2020-01-06 DIAGNOSIS — J45909 Unspecified asthma, uncomplicated: Secondary | ICD-10-CM | POA: Diagnosis not present

## 2020-01-06 DIAGNOSIS — Z20822 Contact with and (suspected) exposure to covid-19: Secondary | ICD-10-CM | POA: Diagnosis not present

## 2020-01-06 DIAGNOSIS — J029 Acute pharyngitis, unspecified: Secondary | ICD-10-CM | POA: Diagnosis not present

## 2020-01-06 DIAGNOSIS — R0602 Shortness of breath: Secondary | ICD-10-CM | POA: Diagnosis present

## 2020-01-06 LAB — COMPREHENSIVE METABOLIC PANEL
ALT: 12 U/L (ref 0–44)
AST: 18 U/L (ref 15–41)
Albumin: 4.4 g/dL (ref 3.5–5.0)
Alkaline Phosphatase: 46 U/L (ref 38–126)
Anion gap: 11 (ref 5–15)
BUN: 9 mg/dL (ref 6–20)
CO2: 23 mmol/L (ref 22–32)
Calcium: 9.1 mg/dL (ref 8.9–10.3)
Chloride: 105 mmol/L (ref 98–111)
Creatinine, Ser: 0.84 mg/dL (ref 0.44–1.00)
GFR calc Af Amer: >60 mL/min (ref >60)
GFR calc non Af Amer: >60 mL/min (ref >60)
Glucose, Bld: 82 mg/dL (ref 70–99)
Potassium: 3.4 mmol/L — ABNORMAL LOW (ref 3.5–5.1)
Sodium: 139 mmol/L (ref 135–145)
Total Bilirubin: 0.9 mg/dL (ref 0.3–1.2)
Total Protein: 7.9 g/dL (ref 6.5–8.1)

## 2020-01-06 LAB — CBC WITH DIFFERENTIAL/PLATELET
Abs Immature Granulocytes: 0.04 10*3/uL (ref 0.00–0.07)
Basophils Absolute: 0 10*3/uL (ref 0.0–0.1)
Basophils Relative: 0 %
Eosinophils Absolute: 0 10*3/uL (ref 0.0–0.5)
Eosinophils Relative: 0 %
HCT: 36.6 % (ref 36.0–46.0)
Hemoglobin: 12.2 g/dL (ref 12.0–15.0)
Immature Granulocytes: 0 %
Lymphocytes Relative: 8 %
Lymphs Abs: 1.1 10*3/uL (ref 0.7–4.0)
MCH: 29.8 pg (ref 26.0–34.0)
MCHC: 33.3 g/dL (ref 30.0–36.0)
MCV: 89.3 fL (ref 80.0–100.0)
Monocytes Absolute: 1.3 10*3/uL — ABNORMAL HIGH (ref 0.1–1.0)
Monocytes Relative: 9 %
Neutro Abs: 11.8 10*3/uL — ABNORMAL HIGH (ref 1.7–7.7)
Neutrophils Relative %: 83 %
Platelets: 287 10*3/uL (ref 150–400)
RBC: 4.1 MIL/uL (ref 3.87–5.11)
RDW: 13.7 % (ref 11.5–15.5)
WBC: 14.3 10*3/uL — ABNORMAL HIGH (ref 4.0–10.5)
nRBC: 0 % (ref 0.0–0.2)

## 2020-01-06 LAB — I-STAT BETA HCG BLOOD, ED (NOT ORDERABLE): I-stat hCG, quantitative: <5 m[IU]/mL (ref <5)

## 2020-01-06 NOTE — ED Triage Notes (Signed)
Shob, cough and chills x 2 days. Denies fever and denies exposure. Covid positive 01/15/2019

## 2020-01-07 ENCOUNTER — Emergency Department (HOSPITAL_COMMUNITY)
Admission: EM | Admit: 2020-01-07 | Discharge: 2020-01-07 | Disposition: A | Discharge status: Home / Self Care | Payer: 59 | Attending: Emergency Medicine | Admitting: Emergency Medicine

## 2020-01-07 DIAGNOSIS — B349 Viral infection, unspecified: Secondary | ICD-10-CM

## 2020-01-07 LAB — POC SARS CORONAVIRUS 2 AG -  ED: SARS Coronavirus 2 Ag: NEGATIVE

## 2020-01-07 LAB — GROUP A STREP BY PCR: Group A Strep by PCR: NOT DETECTED

## 2020-01-07 MED ORDER — ACETAMINOPHEN 500 MG PO TABS
1000.0000 mg | ORAL_TABLET | Freq: Once | ORAL | Status: AC
  Administered 2020-01-07: 1000 mg via ORAL
  Filled 2020-01-07: qty 2

## 2020-01-07 MED ORDER — ONDANSETRON 4 MG PO TBDP: 4.0000 mg | ORAL_TABLET | Freq: Once | ORAL | Status: DC

## 2020-01-07 NOTE — Discharge Instructions (Addendum)
Your laboratory results were within normal limits aside from your white blood cell count which was elevated today.  We discussed this likely being a viral illness, you will need to treat your symptoms at home with over-the-counter medication.  Have provided a note in order to take some days of work in recovery.

## 2020-01-07 NOTE — ED Provider Notes (Signed)
[facility]-EMERGENCY DEPT Provider Note   CSN: [id number] Arrival date & time: 01/06/20  1925     History Chief Complaint  Patient presents with  . Shortness of Breath    Julie Fitzpatrick is a 29 y.o. female.  29 y.o female with a PMH of Asthma presents to the ED with a chief complaint of the aches, sore throat, cough for the past 2 days.  Patient reports symptoms began with a sore throat, does have some pain with swallowing her secretions.  Does report a dry cough with no sputum.  States he is at body aches throughout.  Endorses pain along her chest with the coughing along with chills.  And was Covid positive in June 2020, states that she has not received any vaccines for COVID-19.  She has taken Tylenol to help with symptomatic control without improvement.  Arrived in the ED afebrile, temperature rechecked by me at 101.  No abdominal pain, nausea, vomiting, urinary symptoms.  The history is provided by the patient.  Shortness of Breath Associated symptoms: fever, headaches and sore throat   Associated symptoms: no abdominal pain, no chest pain and no vomiting        Past Medical History:  Diagnosis Date  . Asthma   . GERD (gastroesophageal reflux disease)     There are no problems to display for this patient.   Past Surgical History:  Procedure Laterality Date  . ARTHROSCOPIC REPAIR ACL    . WISDOM TOOTH EXTRACTION       OB History   No obstetric history on file.     No family history on file.  Social History   Tobacco Use  . Smoking status: Never Smoker  . Smokeless tobacco: Never Used  Substance Use Topics  . Alcohol use: No  . Drug use: No    Home Medications Prior to Admission medications   Medication Sig Start Date End Date Taking? Authorizing Provider  acetaminophen (TYLENOL) 500 MG tablet Take 1,000 mg by mouth every 6 (six) hours as needed for mild pain.    [provider]  docusate sodium (COLACE) 100 MG capsule Take  1 capsule (100 mg total) by mouth every 12 (twelve) hours. Patient not taking: Reported on 09/18/2018 10/07/16   Horton, Mayer Masker, MD  fluticasone [facility]) 50 MCG/ACT nasal spray Place 2 sprays into both nostrils daily. Patient not taking: Reported on 10/06/2016 06/01/15   Street, [place], [place]  HYDROcodone-acetaminophen (NORCO/VICODIN) 5-325 MG tablet Take 1 tablet by mouth every 4 (four) hours as needed. 01/20/19   Jacalyn Lefevre, MD  hydrocortisone (ANUSOL-HC) 2.5 % rectal cream Apply rectally 2 times daily Patient not taking: Reported on 09/18/2018 10/07/16   Horton, Mayer Masker, MD  ibuprofen (ADVIL) 600 MG tablet Take 1 tablet (600 mg total) by mouth every 6 (six) hours as needed. 01/20/19   Jacalyn Lefevre, MD  ondansetron (ZOFRAN ODT) 4 MG disintegrating tablet Take 1 tablet (4 mg total) by mouth every 8 (eight) hours as needed. 01/20/19   Jacalyn Lefevre, MD    Allergies    Patient has no known allergies.  Review of Systems   Review of Systems  Constitutional: Positive for chills and fever.  HENT: Positive for sore throat.   Respiratory: Positive for shortness of breath.   Cardiovascular: Negative for chest pain.  Gastrointestinal: Negative for abdominal pain, nausea and vomiting.  Genitourinary: Negative for flank pain.  Neurological: Positive for headaches. Negative for light-headedness.  All other systems reviewed and are  negative.   Physical Exam Updated Vital Signs BP 111/61   Pulse (!) 102   Temp 100.1 F (37.8 C) (Oral)   Resp 18   Ht 5\' 3"  (1.6 m)   Wt 70.8 kg   LMP 12/07/2019   SpO2 100%   BMI 27.63 kg/m   Physical Exam Vitals and nursing note reviewed.  Constitutional:      Appearance: She is well-developed. She is ill-appearing.  HENT:     Head: Normocephalic and atraumatic.     Mouth/Throat:     Mouth: Mucous membranes are moist.     Pharynx: Oropharynx is clear. Uvula midline. Posterior oropharyngeal erythema present.     Tonsils: No tonsillar exudate or  tonsillar abscesses. 1+ on the right. 1+ on the left.  Eyes:     Pupils: Pupils are equal, round, and reactive to light.  Cardiovascular:     Rate and Rhythm: Normal rate.  Pulmonary:     Effort: Pulmonary effort is normal. No tachypnea.     Breath sounds: Normal breath sounds. No decreased breath sounds or wheezing.  Chest:     Chest wall: No tenderness.  Abdominal:     Palpations: Abdomen is soft. There is no mass.  Musculoskeletal:     Right lower leg: No tenderness. No edema.     Left lower leg: No tenderness. No edema.  Skin:    General: Skin is warm and dry.  Neurological:     Mental Status: She is alert and oriented to person, place, and time.     ED Results / Procedures / Treatments   Labs (all labs ordered are listed, but only abnormal results are displayed) Labs Reviewed  CBC WITH DIFFERENTIAL/PLATELET - Abnormal; Notable for the following components:      Result Value   WBC 14.3 (*)    Neutro Abs 11.8 (*)    Monocytes Absolute 1.3 (*)    All other components within normal limits  COMPREHENSIVE METABOLIC PANEL - Abnormal; Notable for the following components:   Potassium 3.4 (*)    All other components within normal limits  GROUP A STREP BY PCR  I-STAT BETA HCG BLOOD, ED ([facility], [facility], [facility] ONLY)  I-STAT BETA HCG BLOOD, ED (NOT ORDERABLE)  POC SARS CORONAVIRUS 2 AG -  ED    EKG None  Radiology DG Chest 2 View  Result Date: 01/06/2020 CLINICAL DATA:  Shortness of breath, cough and chills. EXAM: CHEST - 2 VIEW COMPARISON:  Chest radiograph 01/20/2019 FINDINGS: The heart size and mediastinal contours are within normal limits. Both lungs are clear. The visualized skeletal structures are unremarkable. IMPRESSION: No active cardiopulmonary disease. Electronically Signed   By: Lovey Newcomer M.D.   On: 01/06/2020 20:24    Procedures Procedures (including critical care time)  Medications Ordered in ED Medications  acetaminophen (TYLENOL) tablet 1,000 mg (1,000 mg Oral  Given 01/07/20 0540)    ED Course  I have reviewed the triage vital signs and the nursing notes.  Pertinent labs & imaging results that were available during my care of the patient were reviewed by me and considered in my medical decision making (see chart for details).    MDM Rules/Calculators/A&P   Patient with a past medical history of asthma presents to the ED with a chief complaint of shortness of breath, sore throat, body aches and cough for the past 2 days.  Reports no sick contacts, states symptoms began with her sore throat.  Was Covid positive in 2020 in  the month of June.  Reports she has not received any vaccinations at this time.  Has taken some over-the-counter medication to help with symptomatic control.  She does not currently have any inhalers, arrived in the ED with stable vital signs, temperature is recorded as afebrile on arrival, this was rechecked by me, oral temp is 101.  Does report chills along with body aches.  No nausea, vomiting, abdominal pain.  Interpretation of her labs revealed a CBC with a leukocytosis of 14.3, he reports no episodes of vomiting.  No signs of anemia.  CMP with some mild hypokalemia.  Creatinine levels within normal limits.  LFTs are unremarkable, abdominal exam is benign.  hCG is negative.  Evaluation she does report significant pain along the back of her throat, this does appear erythematous but without any tonsillar exudates.  We discussed testing her for COVID-19 along with strep.  Was provided with Tylenol 650 to help with fever.  COVID-19 test is negative today. Strep test is negative.    6:34 AM patient reevaluated by me, continues to rest while in bed.  Does not have any abdominal pain, chest pain, urinary symptoms.  We discussed this likely being a viral illness after testing came back negative.  Fever is now 100 after 1 g of Tylenol.  We discussed continuing symptomatic treatment at home.  If symptoms worsen she is to return to the  emergency department immediately.  Patient is agreeable of discharge home, return precautions discussed at length.   Portions of this note were generated with Scientist, clinical (histocompatibility and immunogenetics). Dictation errors may occur despite best attempts at proofreading.  Final Clinical Impression(s) / ED Diagnoses Final diagnoses:  Viral illness    Rx / DC Orders ED Discharge Orders    None       Claude Manges, PA-C 01/07/20 0636    Molpus, Jonny Ruiz, MD 01/07/20 [phone]

## 2020-04-23 ENCOUNTER — Emergency Department (HOSPITAL_COMMUNITY)
Admission: EM | Admit: 2020-04-23 | Discharge: 2020-04-23 | Disposition: A | Discharge status: Home / Self Care | Payer: 59 | Attending: Emergency Medicine | Admitting: Emergency Medicine

## 2020-04-23 ENCOUNTER — Other Ambulatory Visit: Payer: Self-pay

## 2020-04-23 ENCOUNTER — Emergency Department (HOSPITAL_COMMUNITY): Payer: 59

## 2020-04-23 ENCOUNTER — Encounter (HOSPITAL_COMMUNITY): Payer: Self-pay | Admitting: Emergency Medicine

## 2020-04-23 DIAGNOSIS — Z5321 Procedure and treatment not carried out due to patient leaving prior to being seen by health care provider: Secondary | ICD-10-CM | POA: Diagnosis not present

## 2020-04-23 DIAGNOSIS — R509 Fever, unspecified: Secondary | ICD-10-CM | POA: Diagnosis not present

## 2020-04-23 DIAGNOSIS — R079 Chest pain, unspecified: Secondary | ICD-10-CM | POA: Insufficient documentation

## 2020-04-23 DIAGNOSIS — R0602 Shortness of breath: Secondary | ICD-10-CM | POA: Diagnosis not present

## 2020-04-23 LAB — CBC
HCT: 37.8 % (ref 36.0–46.0)
Hemoglobin: 12.6 g/dL (ref 12.0–15.0)
MCH: 29.2 pg (ref 26.0–34.0)
MCHC: 33.3 g/dL (ref 30.0–36.0)
MCV: 87.7 fL (ref 80.0–100.0)
Platelets: 292 10*3/uL (ref 150–400)
RBC: 4.31 MIL/uL (ref 3.87–5.11)
RDW: 13.4 % (ref 11.5–15.5)
WBC: 11 10*3/uL — ABNORMAL HIGH (ref 4.0–10.5)
nRBC: 0 % (ref 0.0–0.2)

## 2020-04-23 LAB — BASIC METABOLIC PANEL
Anion gap: 11 (ref 5–15)
BUN: 7 mg/dL (ref 6–20)
CO2: 26 mmol/L (ref 22–32)
Calcium: 9.2 mg/dL (ref 8.9–10.3)
Chloride: 102 mmol/L (ref 98–111)
Creatinine, Ser: 0.86 mg/dL (ref 0.44–1.00)
GFR calc Af Amer: >60 mL/min (ref >60)
GFR calc non Af Amer: >60 mL/min (ref >60)
Glucose, Bld: 104 mg/dL — ABNORMAL HIGH (ref 70–99)
Potassium: 3.4 mmol/L — ABNORMAL LOW (ref 3.5–5.1)
Sodium: 139 mmol/L (ref 135–145)

## 2020-04-23 LAB — I-STAT BETA HCG BLOOD, ED (NOT ORDERABLE): I-stat hCG, quantitative: <5 m[IU]/mL (ref <5)

## 2020-04-23 LAB — TROPONIN I (HIGH SENSITIVITY): Troponin I (High Sensitivity): 2 ng/L (ref <18)

## 2020-04-23 MED ORDER — ACETAMINOPHEN 325 MG PO TABS
650.0000 mg | ORAL_TABLET | Freq: Once | ORAL | Status: AC
  Administered 2020-04-23: 650 mg via ORAL
  Filled 2020-04-23: qty 2

## 2020-04-23 NOTE — ED Triage Notes (Signed)
Patient here from home reporting chest pain and SOB that started Thursday night. Fever.

## 2020-04-23 NOTE — ED Notes (Signed)
Patient was called to recheck vital signs but no response.  

## 2021-12-05 ENCOUNTER — Encounter (HOSPITAL_BASED_OUTPATIENT_CLINIC_OR_DEPARTMENT_OTHER): Payer: Self-pay | Admitting: Emergency Medicine

## 2021-12-05 ENCOUNTER — Emergency Department (HOSPITAL_BASED_OUTPATIENT_CLINIC_OR_DEPARTMENT_OTHER): Payer: Federal, State, Local not specified - PPO

## 2021-12-05 ENCOUNTER — Other Ambulatory Visit: Payer: Self-pay

## 2021-12-05 DIAGNOSIS — R0981 Nasal congestion: Secondary | ICD-10-CM | POA: Diagnosis not present

## 2021-12-05 DIAGNOSIS — R059 Cough, unspecified: Secondary | ICD-10-CM | POA: Diagnosis present

## 2021-12-05 DIAGNOSIS — Z20822 Contact with and (suspected) exposure to covid-19: Secondary | ICD-10-CM | POA: Insufficient documentation

## 2021-12-05 LAB — GROUP A STREP BY PCR: Group A Strep by PCR: NOT DETECTED

## 2021-12-05 LAB — RESP PANEL BY RT-PCR (FLU A&B, COVID) ARPGX2
Influenza A by PCR: NEGATIVE
Influenza B by PCR: NEGATIVE
SARS Coronavirus 2 by RT PCR: NEGATIVE

## 2021-12-05 NOTE — ED Triage Notes (Signed)
Cough, congestion, and sore throat x 2 days. Hx of asthma. ?

## 2021-12-06 ENCOUNTER — Emergency Department (HOSPITAL_BASED_OUTPATIENT_CLINIC_OR_DEPARTMENT_OTHER)
Admission: EM | Admit: 2021-12-06 | Discharge: 2021-12-06 | Disposition: A | Discharge status: Home / Self Care | Payer: Federal, State, Local not specified - PPO | Attending: Emergency Medicine | Admitting: Emergency Medicine

## 2021-12-06 ENCOUNTER — Encounter (HOSPITAL_BASED_OUTPATIENT_CLINIC_OR_DEPARTMENT_OTHER): Payer: Self-pay

## 2021-12-06 DIAGNOSIS — J069 Acute upper respiratory infection, unspecified: Secondary | ICD-10-CM

## 2021-12-06 MED ORDER — BENZONATATE 100 MG PO CAPS: 100.0000 mg | ORAL_CAPSULE | Freq: Three times a day (TID) | ORAL | 0 refills | Status: AC

## 2021-12-06 MED ORDER — BENZONATATE 100 MG PO CAPS
200.0000 mg | ORAL_CAPSULE | Freq: Once | ORAL | Status: AC
  Administered 2021-12-06: 200 mg via ORAL
  Filled 2021-12-06: qty 2

## 2021-12-06 NOTE — ED Notes (Signed)
Patient discharged to home.  All discharge instructions reviewed.  Patient verbalized understanding via teachback method.  VS WDL.  Respirations even and unlabored.  Ambulatory out of ED.   °

## 2021-12-06 NOTE — ED Provider Notes (Signed)
?[facility] EMERGENCY DEPARTMENT ?Provider Note ? ? ?CSN: [id number] ?Arrival date & time: 12/05/21  2227 ? ?  ? ?History ? ?Chief Complaint  ?Patient presents with  ? Cough  ? ? ?Julie Fitzpatrick is a 31 y.o. female. ? ?The history is provided by the patient.  ?Cough ?Cough characteristics:  Non-productive ?Severity:  Moderate ?Onset quality:  Gradual ?Duration:  1 day ?Timing:  Sporadic ?Progression:  Unchanged ?Chronicity:  New ?Context: upper respiratory infection   ?Relieved by:  Nothing ?Worsened by:  Nothing ?Ineffective treatments:  None tried ?Associated symptoms: sinus congestion   ?Associated symptoms: no chest pain, no fever, no shortness of breath and no wheezing   ?Risk factors: no chemical exposure   ? ?  ? ?Home Medications ?Prior to Admission medications   ?Medication Sig Start Date End Date Taking? Authorizing Provider  ?acetaminophen (TYLENOL) 500 MG tablet Take 1,000 mg by mouth every 6 (six) hours as needed for mild pain.    [provider]  ?docusate sodium (COLACE) 100 MG capsule Take 1 capsule (100 mg total) by mouth every 12 (twelve) hours. ?Patient not taking: Reported on 09/18/2018 10/07/16   Horton, Mayer Masker, MD  ?fluticasone [facility]) 50 MCG/ACT nasal spray Place 2 sprays into both nostrils daily. ?Patient not taking: Reported on 10/06/2016 06/01/15   Street, [place], PA-C  ?HYDROcodone-acetaminophen (NORCO/VICODIN) 5-325 MG tablet Take 1 tablet by mouth every 4 (four) hours as needed. 01/20/19   Jacalyn Lefevre, MD  ?hydrocortisone (ANUSOL-HC) 2.5 % rectal cream Apply rectally 2 times daily ?Patient not taking: Reported on 09/18/2018 10/07/16   Horton, Mayer Masker, MD  ?ibuprofen (ADVIL) 600 MG tablet Take 1 tablet (600 mg total) by mouth every 6 (six) hours as needed. 01/20/19   Jacalyn Lefevre, MD  ?ondansetron (ZOFRAN ODT) 4 MG disintegrating tablet Take 1 tablet (4 mg total) by mouth every 8 (eight) hours as needed. 01/20/19   Jacalyn Lefevre, MD  ?   ? ?Allergies    ?Patient  has no known allergies.   ? ?Review of Systems   ?Review of Systems  ?Constitutional:  Negative for fever.  ?HENT:  Positive for congestion. Negative for facial swelling.   ?Eyes:  Negative for redness.  ?Respiratory:  Positive for cough. Negative for shortness of breath and wheezing.   ?Cardiovascular:  Negative for chest pain.  ?Gastrointestinal:  Negative for abdominal pain.  ?Genitourinary:  Negative for difficulty urinating.  ?Neurological:  Negative for facial asymmetry.  ?Psychiatric/Behavioral:  Negative for agitation.   ?All other systems reviewed and are negative. ? ?Physical Exam ?Updated Vital Signs ?BP 113/77   Pulse 94   Temp 99 ?F (37.2 ?C) (Oral)   Resp 18   Wt 79.8 kg   LMP 11/19/2021 (Approximate)   SpO2 99%   BMI 31.18 kg/m?  ?Physical Exam ?Vitals and nursing note reviewed.  ?Constitutional:   ?   General: She is not in acute distress. ?   Appearance: Normal appearance.  ?HENT:  ?   Head: Normocephalic and atraumatic.  ?   Nose: Congestion present.  ?Eyes:  ?   Pupils: Pupils are equal, round, and reactive to light.  ?Cardiovascular:  ?   Rate and Rhythm: Normal rate and regular rhythm.  ?   Pulses: Normal pulses.  ?   Heart sounds: Normal heart sounds.  ?Pulmonary:  ?   Effort: Pulmonary effort is normal. No respiratory distress.  ?   Breath sounds: Normal breath sounds.  ?Abdominal:  ?  General: Bowel sounds are normal.  ?   Palpations: Abdomen is soft.  ?   Tenderness: There is no abdominal tenderness. There is no guarding.  ?Musculoskeletal:     ?   General: Normal range of motion.  ?   Cervical back: Normal range of motion and neck supple.  ?Skin: ?   General: Skin is warm and dry.  ?   Capillary Refill: Capillary refill takes less than 2 seconds.  ?Neurological:  ?   General: No focal deficit present.  ?   Mental Status: She is alert and oriented to person, place, and time.  ?   Deep Tendon Reflexes: Reflexes normal.  ?Psychiatric:     ?   Mood and Affect: Mood normal.     ?    Behavior: Behavior normal.  ? ? ?ED Results / Procedures / Treatments   ?Labs ?(all labs ordered are listed, but only abnormal results are displayed) ?Labs Reviewed  ?GROUP A STREP BY PCR  ?RESP PANEL BY RT-PCR (FLU A&B, COVID) [id number]  ? ? ?EKG ?None ? ?Radiology ?DG Chest 2 View ? ?Result Date: 12/05/2021 ?CLINICAL DATA:  Cough. History of asthma. Congestion and sore throat. EXAM: CHEST - 2 VIEW COMPARISON:  04/23/2020 FINDINGS: The cardiomediastinal contours are normal. Mild peribronchial thickening. Pulmonary vasculature is normal. No consolidation, pleural effusion, or pneumothorax. No acute osseous abnormalities are seen. IMPRESSION: Mild peribronchial thickening suggestive of asthma or bronchitis. Electronically Signed   By: Narda Rutherford M.D.   On: 12/05/2021 22:59   ? ?Procedures ?Procedures  ? ? ?Medications Ordered in ED ?Medications  ?benzonatate (TESSALON) capsule 200 mg (has no administration in time range)  ? ? ?ED Course/ Medical Decision Making/ A&P ?  ?                        ?Medical Decision Making ?URI x 24 hours  ? ?Amount and/or Complexity of Data Reviewed ?Labs: ordered. ?   Details: all labs reviewed: negative covid and flu and strep ?Radiology: ordered and independent interpretation performed. ?   Details: No PNA by me ? ?Risk ?Prescription drug management. ?Risk Details: Patient with URI symptoms x 24 hours.  No PNA, no covid.  Stable for discharge with close follow up.   ? ? ? ?Final Clinical Impression(s) / ED Diagnoses ?Final diagnoses:  ?None  ? ?Return for intractable cough, coughing up blood, fevers > 100.4 unrelieved by medication, shortness of breath, intractable vomiting, chest pain, shortness of breath, weakness, numbness, changes in speech, facial asymmetry, abdominal pain, passing out, Inability to tolerate liquids or food, cough, altered mental status or any concerns. No signs of systemic illness or infection. The patient is nontoxic-appearing on exam and vital signs are  within normal limits.  ?I have reviewed the triage vital signs and the nursing notes. Pertinent labs & imaging results that were available during my care of the patient were reviewed by me and considered in my medical decision making (see chart for details). After history, exam, and medical workup I feel the patient has been appropriately medically screened and is safe for discharge home. Pertinent diagnoses were discussed with the patient. Patient was given return precautions. ?  ?  ?Rx / DC Orders ?ED Discharge Orders   ? ? None  ? ?  ? ? ?  ?Anthem Frazer, MD ?12/06/21 0040 ? ?

## 2024-02-28 IMAGING — DX DG CHEST 2V
2 series · 2 of 2 positions shown · non-contrast
Comparison: 04/23/2020

CLINICAL DATA: Cough. History of asthma. Congestion and sore
throat.

EXAM:
CHEST - 2 VIEW

[chest pa]
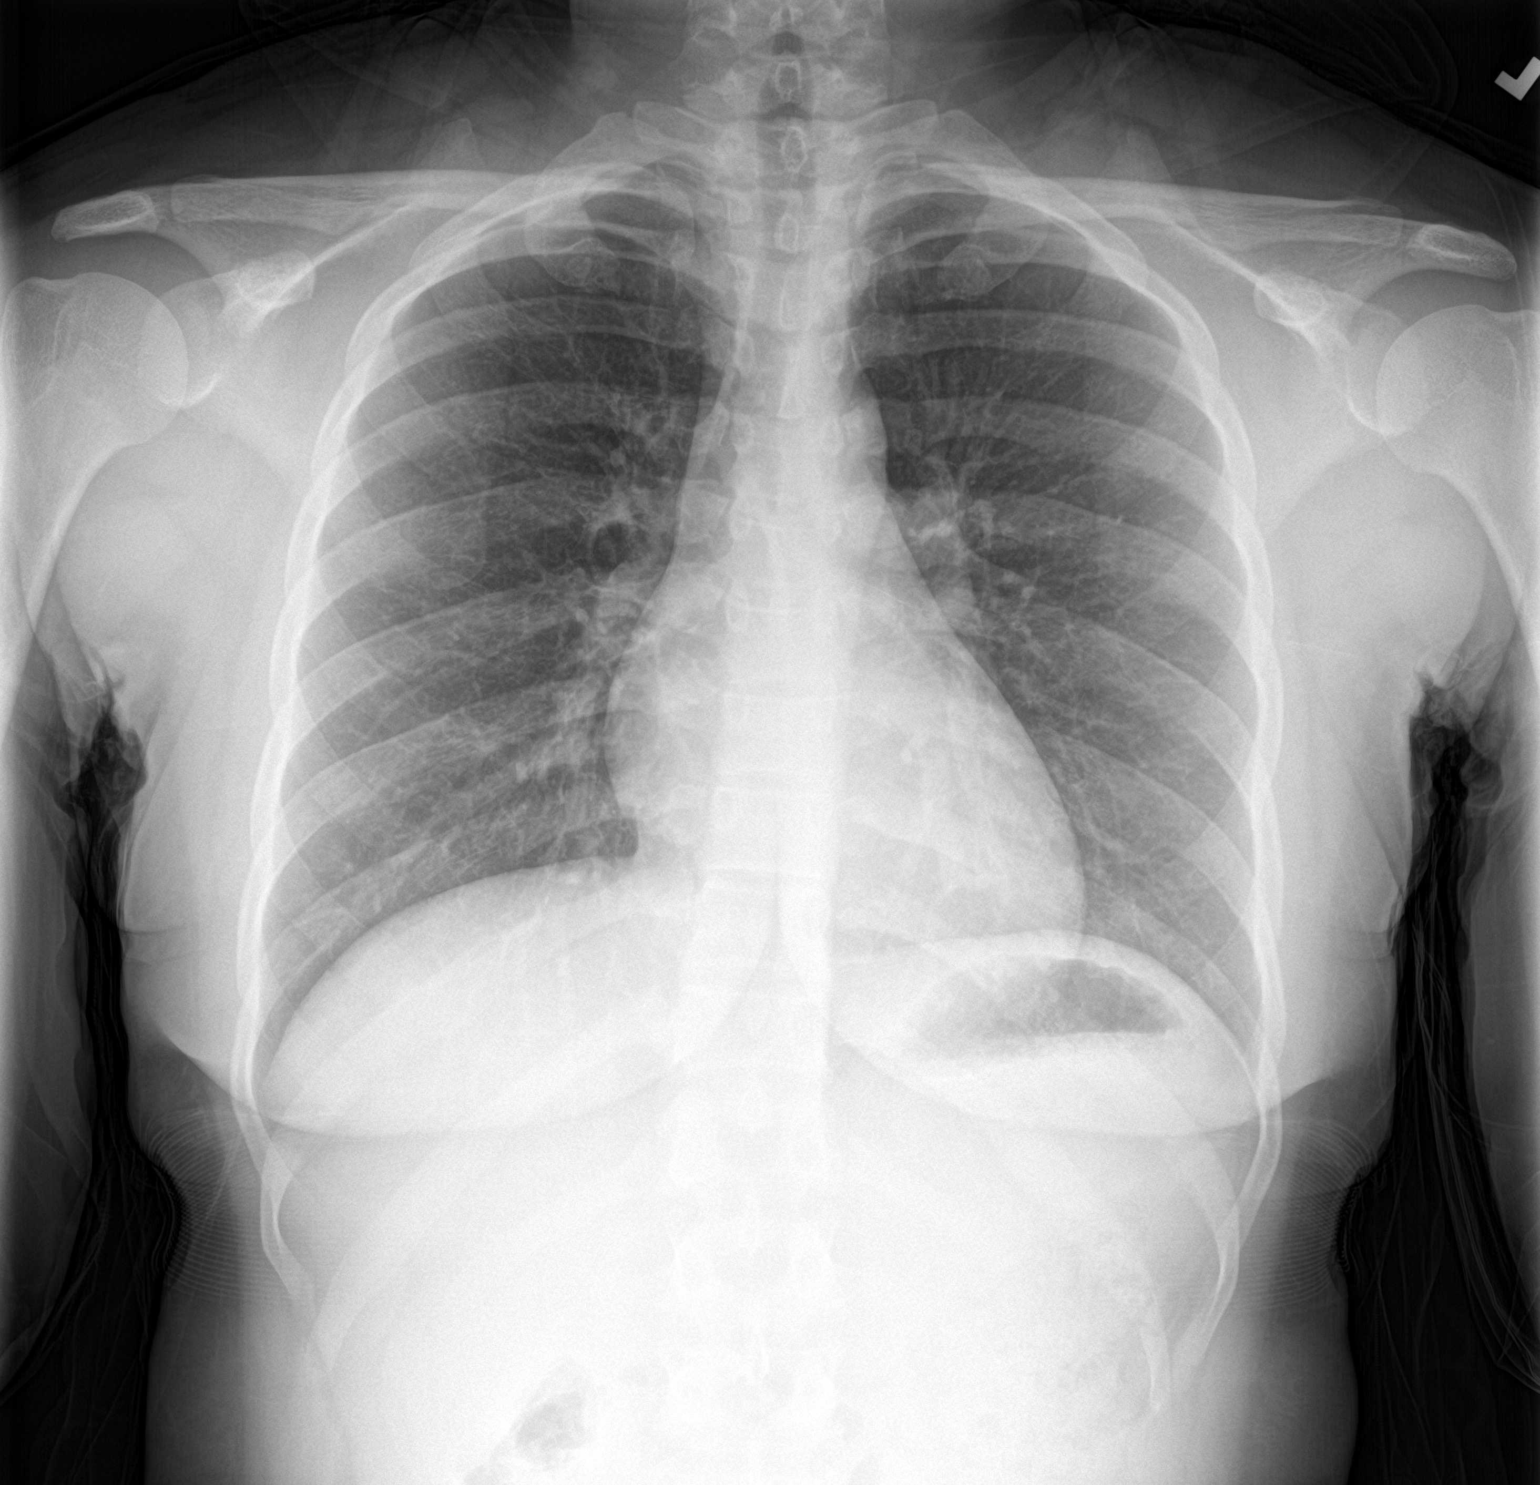

[chest lat]
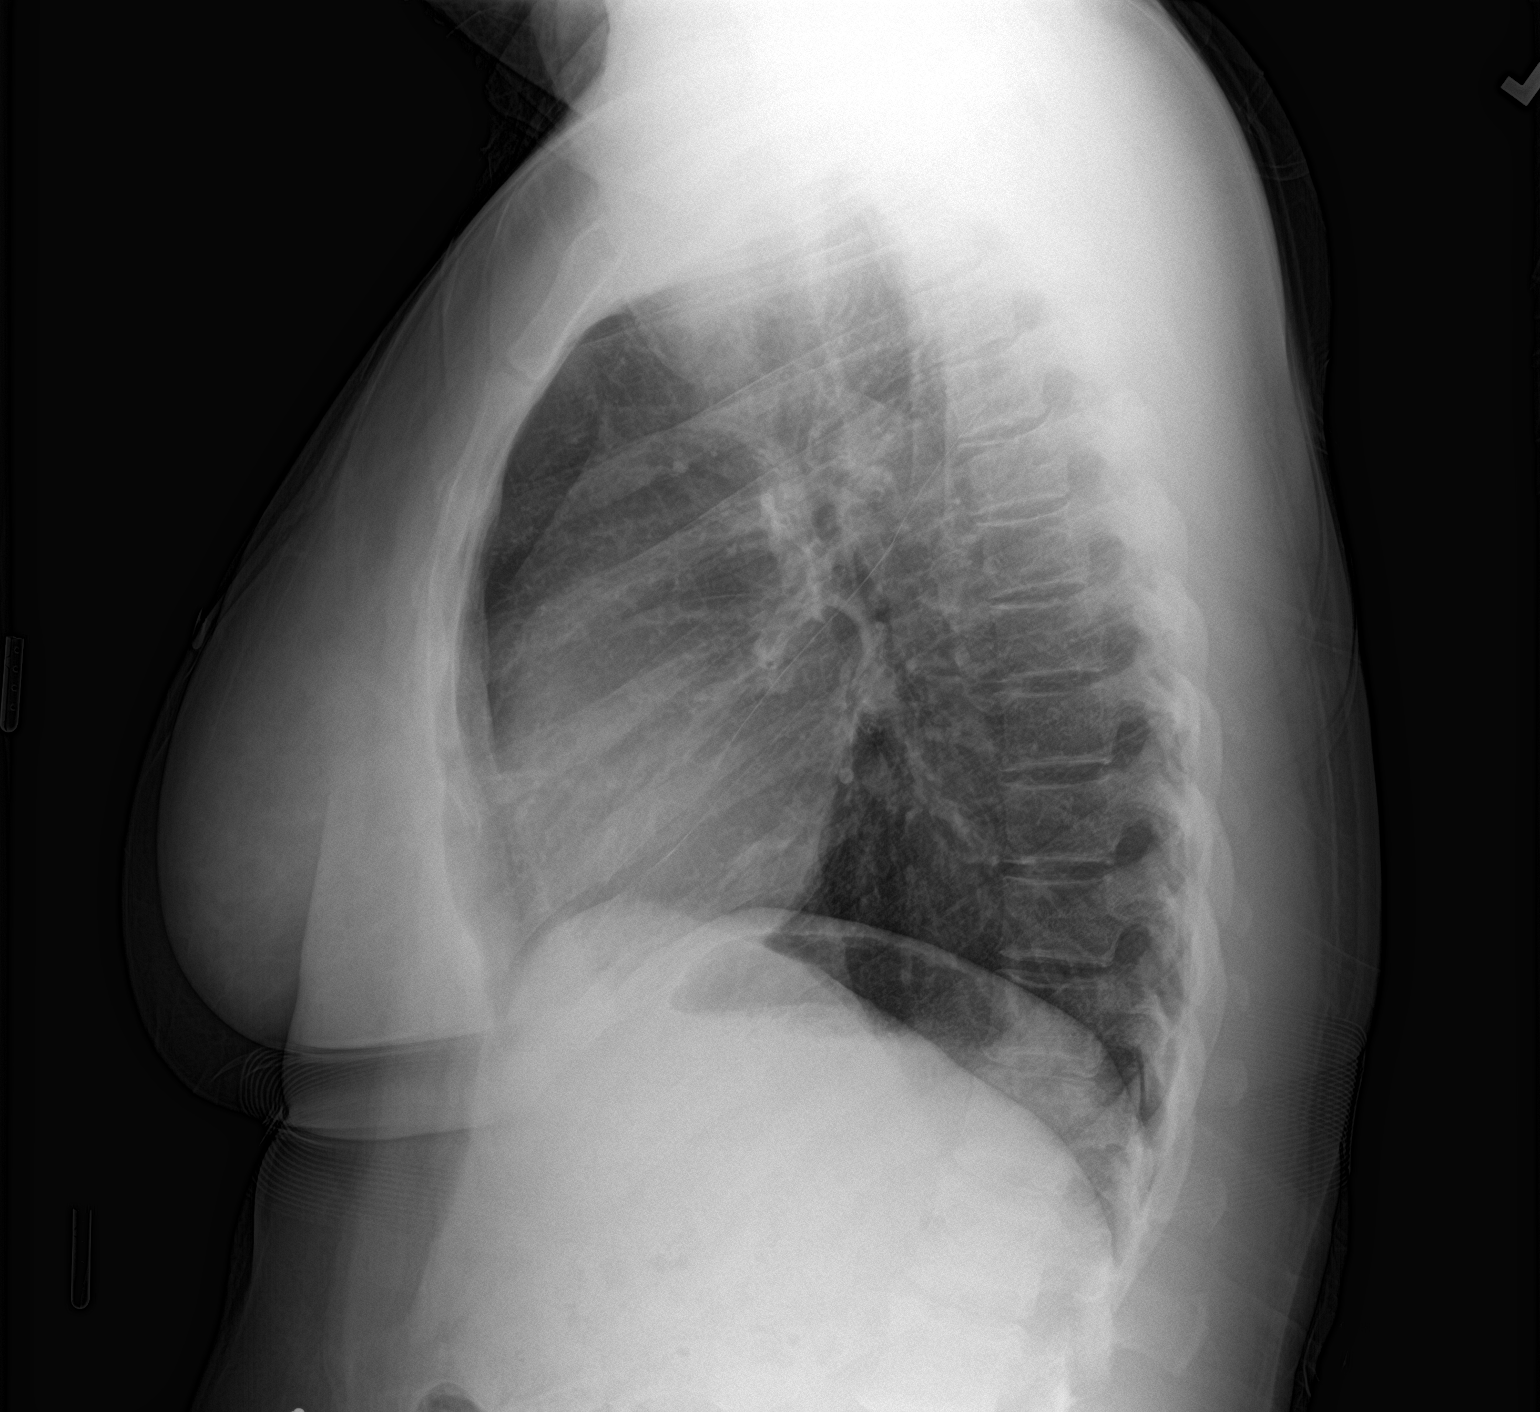

[2 of 2 positions shown; findings below may reference images not displayed]

FINDINGS: The cardiomediastinal contours are normal. Mild peribronchial
thickening. Pulmonary vasculature is normal. No consolidation,
pleural effusion, or pneumothorax. No acute osseous abnormalities
are seen.
IMPRESSION: Mild peribronchial thickening suggestive of asthma or bronchitis.

## 2024-07-08 ENCOUNTER — Emergency Department (HOSPITAL_BASED_OUTPATIENT_CLINIC_OR_DEPARTMENT_OTHER)

## 2024-07-08 ENCOUNTER — Emergency Department (HOSPITAL_BASED_OUTPATIENT_CLINIC_OR_DEPARTMENT_OTHER)
Admission: EM | Admit: 2024-07-08 | Discharge: 2024-07-09 | Disposition: A | Discharge status: Home / Self Care | Attending: Emergency Medicine | Admitting: Emergency Medicine

## 2024-07-08 ENCOUNTER — Other Ambulatory Visit: Payer: Self-pay

## 2024-07-08 DIAGNOSIS — M79671 Pain in right foot: Secondary | ICD-10-CM | POA: Insufficient documentation

## 2024-07-08 NOTE — ED Triage Notes (Signed)
 Pt POV reporting R foot pain/swelling since yesterday, denies trauma/swelling.

## 2024-07-09 MED ORDER — COLCHICINE 0.6 MG PO TABS: ORAL_TABLET | ORAL | 0 refills | Status: AC
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
## 2024-07-09 NOTE — Discharge Instructions (Signed)
 You have pain at a very common site the people get gout.  I have given you a prescription for a medication that can help.  That same medication also helps really well for arthritis or an injury.  Please follow-up with your family doctor in the office.  Max OTC meds listed below can take up to that dose at home.   Take 4 over the counter ibuprofen  tablets 3 times a day or 2 over-the-counter naproxen  tablets twice a day for pain. Also take tylenol  1000mg (2 extra strength) four times a day.
